# Patient Record
Sex: Female | Born: 1995 | Race: White | Hispanic: No | Marital: Single | Smoking: Never smoker
Health system: Southern US, Community
[De-identification: ages and names within clinical notes are randomized; demographics above are authoritative.]

## PROBLEM LIST (undated history)

## (undated) DIAGNOSIS — E8809 Other disorders of plasma-protein metabolism, not elsewhere classified: Secondary | ICD-10-CM

## (undated) DIAGNOSIS — Z789 Other specified health status: Secondary | ICD-10-CM

## (undated) DIAGNOSIS — K219 Gastro-esophageal reflux disease without esophagitis: Secondary | ICD-10-CM

## (undated) DIAGNOSIS — S3992XA Unspecified injury of lower back, initial encounter: Secondary | ICD-10-CM

## (undated) DIAGNOSIS — R109 Unspecified abdominal pain: Secondary | ICD-10-CM

## (undated) HISTORY — PX: NO PAST SURGERIES: SHX2092

## (undated) HISTORY — PX: HERNIA REPAIR: SHX51

---

## 2018-08-08 ENCOUNTER — Other Ambulatory Visit: Payer: Self-pay

## 2018-08-08 DIAGNOSIS — Z20822 Contact with and (suspected) exposure to covid-19: Secondary | ICD-10-CM

## 2018-08-09 ENCOUNTER — Telehealth: Payer: Self-pay | Admitting: General Practice

## 2018-08-09 LAB — NOVEL CORONAVIRUS, NAA: SARS-CoV-2, NAA: NOT DETECTED

## 2018-08-09 NOTE — Telephone Encounter (Signed)
Pt received negative covid results.   °

## 2018-09-23 ENCOUNTER — Encounter: Payer: Self-pay | Admitting: Emergency Medicine

## 2018-09-23 ENCOUNTER — Emergency Department
Admission: EM | Admit: 2018-09-23 | Discharge: 2018-09-23 | Disposition: A | Discharge status: Home / Self Care | Payer: Medicaid Other | Attending: Emergency Medicine | Admitting: Emergency Medicine

## 2018-09-23 ENCOUNTER — Other Ambulatory Visit: Payer: Self-pay

## 2018-09-23 DIAGNOSIS — J069 Acute upper respiratory infection, unspecified: Secondary | ICD-10-CM

## 2018-09-23 DIAGNOSIS — Z20828 Contact with and (suspected) exposure to other viral communicable diseases: Secondary | ICD-10-CM | POA: Insufficient documentation

## 2018-09-23 DIAGNOSIS — R0981 Nasal congestion: Secondary | ICD-10-CM | POA: Diagnosis present

## 2018-09-23 LAB — GROUP A STREP BY PCR: Group A Strep by PCR: NOT DETECTED

## 2018-09-23 MED ORDER — LIDOCAINE VISCOUS HCL 2 % MT SOLN: 10.0000 mL | OROMUCOSAL | 0 refills | Status: DC | PRN

## 2018-09-23 MED ORDER — LIDOCAINE VISCOUS HCL 2 % MT SOLN
15.0000 mL | Freq: Once | OROMUCOSAL | Status: AC
  Administered 2018-09-23: 16:00:00 15 mL via OROMUCOSAL
  Filled 2018-09-23: qty 15

## 2018-09-23 MED ORDER — ACETAMINOPHEN 325 MG PO TABS
650.0000 mg | ORAL_TABLET | Freq: Once | ORAL | Status: AC
  Administered 2018-09-23: 650 mg via ORAL
  Filled 2018-09-23: qty 2

## 2018-09-23 NOTE — ED Provider Notes (Signed)
[facility] Emergency Department Provider Note  ____________________________________________  Time seen: Approximately 4:03 PM  I have reviewed the triage vital signs and the nursing notes.   HISTORY  Chief Complaint Sore Throat and Generalized Body Aches    HPI Diane Wyatt is a 23 y.o. female presents to the emergency department for evaluation of nasal congestion, sore throat, nonproductive cough, body aches for 2 days.  Patient states that she is only coughing because her sore throat is scratchy.  Patient is 3 months pregnant.  She sees OB/GYN on Tuesday.  No vomiting, abdominal pain, vaginal bleeding.  No pregnancy concerns.  No fever, shortness of breath, chest pain.  History reviewed. No pertinent past medical history.  There are no active problems to display for this patient.   History reviewed. No pertinent surgical history.  Prior to Admission medications   Medication Sig Start Date End Date Taking? Authorizing Provider  lidocaine (XYLOCAINE) 2 % solution Use as directed 10 mLs in the mouth or throat as needed. Swish and spit 09/23/18   Laban Emperor, PA-C    Allergies Patient has no known allergies.  History reviewed. No pertinent family history.  Social History Social History   Tobacco Use  . Smoking status: Never Smoker  . Smokeless tobacco: Never Used  Substance Use Topics  . Alcohol use: Never    Frequency: Never  . Drug use: Never     Review of Systems  Constitutional: No fever/chills Eyes: No visual changes. No discharge. ENT: Positive for congestion and rhinorrhea. Cardiovascular: No chest pain. Respiratory: Positive for occasional non productive cough. No SOB. Gastrointestinal: No abdominal pain.  No nausea, no vomiting.  No diarrhea.  No constipation. Musculoskeletal: Negative for musculoskeletal pain. Skin: Negative for rash, abrasions, lacerations, ecchymosis. Neurological: Negative for  headaches.   ____________________________________________   PHYSICAL EXAM:  VITAL SIGNS: ED Triage Vitals  Enc Vitals Group     BP 09/23/18 1415 119/73     Pulse Rate 09/23/18 1415 (!) 106     Resp 09/23/18 1415 16     Temp 09/23/18 1415 98.9 F (37.2 C)     Temp Source 09/23/18 1415 Oral     SpO2 09/23/18 1415 99 %     Weight 09/23/18 1415 113 lb (51.3 kg)     Height 09/23/18 1415 5\' 1"  (1.549 m)     Head Circumference --      Peak Flow --      Pain Score 09/23/18 1419 8     Pain Loc --      Pain Edu? --      Excl. in [place]? --      Constitutional: Alert and oriented. Well appearing and in no acute distress. Eyes: Conjunctivae are normal. PERRL. EOMI. No discharge. Head: Atraumatic. ENT: No frontal and maxillary sinus tenderness.      Ears: Tympanic membranes pearly gray with good landmarks. No discharge.      Nose: Mild congestion/rhinnorhea.      Mouth/Throat: Mucous membranes are moist. Oropharynx erythematous. Tonsils not enlarged. No exudates. Uvula midline. Neck: No stridor.   Hematological/Lymphatic/Immunilogical: No cervical lymphadenopathy. Cardiovascular: Normal rate, regular rhythm.  Good peripheral circulation. Respiratory: Normal respiratory effort without tachypnea or retractions. Lungs CTAB. Good air entry to the bases with no decreased or absent breath sounds. Gastrointestinal: Bowel sounds 4 quadrants. Soft and nontender to palpation. No guarding or rigidity. No palpable masses. No distention. Musculoskeletal: Full range of motion to all extremities. No gross deformities appreciated. Neurologic:  Normal speech and language. No gross focal neurologic deficits are appreciated.  Skin:  Skin is warm, dry and intact. No rash noted. Psychiatric: Mood and affect are normal. Speech and behavior are normal. Patient exhibits appropriate insight and judgement.   ____________________________________________   LABS (all labs ordered are listed, but only abnormal  results are displayed)  Labs Reviewed  GROUP A STREP BY PCR  NOVEL CORONAVIRUS, NAA (HOSP ORDER, SEND-OUT TO REF LAB; TAT 18-24 HRS)   ____________________________________________  EKG   ____________________________________________  RADIOLOGY  No results found.  ____________________________________________    PROCEDURES  Procedure(s) performed:    Procedures    Medications  lidocaine (XYLOCAINE) 2 % viscous mouth solution 15 mL (15 mLs Mouth/Throat Given 09/23/18 1613)  acetaminophen (TYLENOL) tablet 650 mg (650 mg Oral Given 09/23/18 1612)     ____________________________________________   INITIAL IMPRESSION / ASSESSMENT AND PLAN / ED COURSE  Pertinent labs & imaging results that were available during my care of the patient were reviewed by me and considered in my medical decision making (see chart for details).  Review of the [place] CSRS was performed in accordance of the [facility] prior to dispensing any controlled drugs.     Patient's diagnosis is consistent with viral URI. Vital signs and exam are reassuring.  Strep test is negative.  COVID test is pending.  Patient is 3 months pregnant and has follow-up with OB on Tuesday.  She denies any pregnancy concerns today.  No abdominal pain, vaginal bleeding.  No fevers.  Patient appears well and is staying well hydrated. Patient feels comfortable going home. Patient will be discharged home with prescriptions for viscous lidocaine to swish and spit. Patient is to follow up with obstetrics as needed or otherwise directed. Patient is given ED precautions to return to the ED for any worsening or new symptoms.  Diane LewandowskyJulie Wyatt was evaluated in Emergency Department on 09/23/2018 for the symptoms described in the history of present illness. She was evaluated in the context of the global COVID-19 pandemic, which necessitated consideration that the patient might be at risk for infection with the SARS-CoV-2 virus that causes COVID-19.  Institutional protocols and algorithms that pertain to the evaluation of patients at risk for COVID-19 are in a state of rapid change based on information released by regulatory bodies including the CDC and federal and state organizations. These policies and algorithms were followed during the patient's care in the ED.   ____________________________________________  FINAL CLINICAL IMPRESSION(S) / ED DIAGNOSES  Final diagnoses:  Viral URI      NEW MEDICATIONS STARTED DURING THIS VISIT:  ED Discharge Orders         Ordered    lidocaine (XYLOCAINE) 2 % solution  As needed     09/23/18 1611              This chart was dictated using voice recognition software/Dragon. Despite best efforts to proofread, errors can occur which can change the meaning. Any change was purely unintentional.    [place] DerryWagner, Markiesha Delia, PA-C 09/23/18 1732    Jene EveryKinner, Robert, MD 09/25/18 [phone]

## 2018-09-23 NOTE — ED Notes (Signed)
See triage note   Presents with sore throat for couple of days  Afebrile on arrival

## 2018-09-23 NOTE — ED Notes (Signed)
First Nurse Note: Pt c/o Sore throat and body aches. Pt is in NAD at this time.

## 2018-09-23 NOTE — Discharge Instructions (Signed)
Your strep test was negative.  Your COVID test is pending.  Please follow-up with obstetrics on Tuesday.  You can use mouthwash to swish and spit.  Take Tylenol for pain.  Return to the emergency department for any worsening of symptoms.

## 2018-09-23 NOTE — ED Triage Notes (Signed)
Pt has sore throat that started yesterday. Also c/o sore throat and body aches

## 2018-09-25 ENCOUNTER — Telehealth: Payer: Self-pay | Admitting: General Practice

## 2018-09-25 LAB — NOVEL CORONAVIRUS, NAA (HOSP ORDER, SEND-OUT TO REF LAB; TAT 18-24 HRS): SARS-CoV-2, NAA: NOT DETECTED

## 2018-09-25 NOTE — Telephone Encounter (Signed)
Negative COVID results given. Patient results "NOT Detected." Caller expressed understanding. ° °

## 2018-10-26 ENCOUNTER — Emergency Department: Payer: Medicaid Other

## 2018-10-26 ENCOUNTER — Other Ambulatory Visit: Payer: Self-pay

## 2018-10-26 ENCOUNTER — Emergency Department
Admission: EM | Admit: 2018-10-26 | Discharge: 2018-10-26 | Disposition: A | Discharge status: Home / Self Care | Payer: Medicaid Other | Attending: Emergency Medicine | Admitting: Emergency Medicine

## 2018-10-26 DIAGNOSIS — R109 Unspecified abdominal pain: Secondary | ICD-10-CM

## 2018-10-26 DIAGNOSIS — O26892 Other specified pregnancy related conditions, second trimester: Secondary | ICD-10-CM

## 2018-10-26 DIAGNOSIS — O99891 Other specified diseases and conditions complicating pregnancy: Secondary | ICD-10-CM | POA: Diagnosis present

## 2018-10-26 DIAGNOSIS — Z3A15 15 weeks gestation of pregnancy: Secondary | ICD-10-CM | POA: Insufficient documentation

## 2018-10-26 DIAGNOSIS — Z3492 Encounter for supervision of normal pregnancy, unspecified, second trimester: Secondary | ICD-10-CM

## 2018-10-26 LAB — URINALYSIS, COMPLETE (UACMP) WITH MICROSCOPIC
Bilirubin Urine: NEGATIVE
Glucose, UA: NEGATIVE mg/dL
Hgb urine dipstick: NEGATIVE
Ketones, ur: NEGATIVE mg/dL
Leukocytes,Ua: NEGATIVE
Nitrite: NEGATIVE
Protein, ur: NEGATIVE mg/dL
Specific Gravity, Urine: 1.012 (ref 1.005–1.030)
pH: 6 (ref 5.0–8.0)

## 2018-10-26 LAB — COMPREHENSIVE METABOLIC PANEL
ALT: 18 U/L (ref 0–44)
AST: 17 U/L (ref 15–41)
Albumin: 3.9 g/dL (ref 3.5–5.0)
Alkaline Phosphatase: 27 U/L — ABNORMAL LOW (ref 38–126)
Anion gap: 8 (ref 5–15)
BUN: 7 mg/dL (ref 6–20)
CO2: 24 mmol/L (ref 22–32)
Calcium: 9.2 mg/dL (ref 8.9–10.3)
Chloride: 106 mmol/L (ref 98–111)
Creatinine, Ser: 0.47 mg/dL (ref 0.44–1.00)
GFR calc Af Amer: >60 mL/min (ref >60)
GFR calc non Af Amer: >60 mL/min (ref >60)
Glucose, Bld: 105 mg/dL — ABNORMAL HIGH (ref 70–99)
Potassium: 3.6 mmol/L (ref 3.5–5.1)
Sodium: 138 mmol/L (ref 135–145)
Total Bilirubin: 0.5 mg/dL (ref 0.3–1.2)
Total Protein: 6.6 g/dL (ref 6.5–8.1)

## 2018-10-26 LAB — HCG, QUANTITATIVE, PREGNANCY: hCG, Beta Chain, Quant, S: 44624 m[IU]/mL — ABNORMAL HIGH (ref <5)

## 2018-10-26 LAB — CBC
HCT: 34.1 % — ABNORMAL LOW (ref 36.0–46.0)
Hemoglobin: 11.2 g/dL — ABNORMAL LOW (ref 12.0–15.0)
MCH: 31.5 pg (ref 26.0–34.0)
MCHC: 32.8 g/dL (ref 30.0–36.0)
MCV: 95.8 fL (ref 80.0–100.0)
Platelets: 148 10*3/uL — ABNORMAL LOW (ref 150–400)
RBC: 3.56 MIL/uL — ABNORMAL LOW (ref 3.87–5.11)
RDW: 13.2 % (ref 11.5–15.5)
WBC: 6.7 10*3/uL (ref 4.0–10.5)
nRBC: 0 % (ref 0.0–0.2)

## 2018-10-26 NOTE — ED Notes (Signed)
Patient transported to Ultrasound 

## 2018-10-26 NOTE — Discharge Instructions (Signed)
Return to the ER for worsening symptoms, persistent vomiting, vaginal bleeding or other concerns.
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
## 2019-01-19 ENCOUNTER — Other Ambulatory Visit: Payer: Self-pay

## 2019-01-19 ENCOUNTER — Observation Stay
Admission: EM | Admit: 2019-01-19 | Discharge: 2019-01-19 | Disposition: A | Discharge status: Home / Self Care | Payer: Medicaid Other | Attending: Obstetrics and Gynecology | Admitting: Obstetrics and Gynecology

## 2019-01-19 DIAGNOSIS — Z3A28 28 weeks gestation of pregnancy: Secondary | ICD-10-CM | POA: Diagnosis not present

## 2019-01-19 DIAGNOSIS — O368130 Decreased fetal movements, third trimester, not applicable or unspecified: Secondary | ICD-10-CM | POA: Diagnosis not present

## 2019-01-19 HISTORY — DX: Other specified health status: Z78.9

## 2019-01-19 NOTE — OB Triage Note (Signed)
Discharge instructions reviewed with patient and patient educated on fetal kick counts.Pt instructed when to call provider or return to the hospital for evaluation. Pt discharged home in stable condition with significant other.

## 2019-01-19 NOTE — Progress Notes (Signed)
Provider notified of patient arrival to unit. Dr. Valentino Saxon notified that Fetal baseline is 150 with 15x15 accels present and no decels noted. Verbal order given to Discharge patient home.
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
## 2019-01-20 NOTE — Discharge Summary (Signed)
L&D OB Triage Note  Diane Wyatt is an unassigned 24 y.o. G1P0 female at 108w0d, EDD Estimated Date of Delivery: 04/13/19 who presented to triage for complaints of decreased fetal movement x 1 day. She receives [facility]. Reports that she tried drinking soda and taking a hot shower but still could not elicit movement. She was evaluated by the nurses with no significant findings. Vital signs stable. An NST was performed and has been reviewed by MD.    NST INTERPRETATION: Indications: decreased fetal movement  Mode: External Baseline Rate (A): 150 bpm Variability: Moderate Accelerations: 15 x 15 Decelerations: None     Contraction Frequency (min): occasional with UI  Impression: reactive   Plan: NST performed was reviewed and was found to be reactive. She was discharged home with fetal kick count precautions.  Continue routine prenatal care. Follow up with OB/GYN as previously scheduled.     Hildred Laser, MD  [facility]

## 2019-03-06 ENCOUNTER — Other Ambulatory Visit: Payer: Self-pay

## 2019-03-06 ENCOUNTER — Observation Stay
Admission: EM | Admit: 2019-03-06 | Discharge: 2019-03-06 | Disposition: A | Discharge status: Home / Self Care | Payer: Medicaid Other | Attending: Obstetrics and Gynecology | Admitting: Obstetrics and Gynecology

## 2019-03-06 ENCOUNTER — Encounter: Payer: Self-pay | Admitting: Obstetrics and Gynecology

## 2019-03-06 DIAGNOSIS — Z3490 Encounter for supervision of normal pregnancy, unspecified, unspecified trimester: Secondary | ICD-10-CM

## 2019-03-06 DIAGNOSIS — O26893 Other specified pregnancy related conditions, third trimester: Principal | ICD-10-CM | POA: Insufficient documentation

## 2019-03-06 DIAGNOSIS — R109 Unspecified abdominal pain: Secondary | ICD-10-CM | POA: Insufficient documentation

## 2019-03-06 DIAGNOSIS — R1030 Lower abdominal pain, unspecified: Secondary | ICD-10-CM

## 2019-03-06 DIAGNOSIS — Z3A34 34 weeks gestation of pregnancy: Secondary | ICD-10-CM | POA: Insufficient documentation

## 2019-03-06 DIAGNOSIS — O99891 Other specified diseases and conditions complicating pregnancy: Secondary | ICD-10-CM | POA: Diagnosis not present

## 2019-03-06 DIAGNOSIS — M545 Low back pain: Secondary | ICD-10-CM

## 2019-03-06 DIAGNOSIS — R102 Pelvic and perineal pain: Secondary | ICD-10-CM | POA: Diagnosis not present

## 2019-03-06 HISTORY — DX: Unspecified injury of lower back, initial encounter: S39.92XA

## 2019-03-06 NOTE — Discharge Instructions (Signed)
Round Ligament Pain  The round ligament is a cord of muscle and tissue that helps support the uterus. It can become a source of pain during pregnancy if it becomes stretched or twisted as the baby grows. The pain usually begins in the second trimester (13-28 weeks) of pregnancy, and it can come and go until the baby is delivered. It is not a serious problem, and it does not cause harm to the baby. Round ligament pain is usually a short, sharp, and pinching pain, but it can also be a dull, lingering, and aching pain. The pain is felt in the lower side of the abdomen or in the groin. It usually starts deep in the groin and moves up to the outside of the hip area. The pain may occur when you:  Suddenly change position, such as quickly going from a sitting to standing position.  Roll over in bed.  Cough or sneeze.  Do physical activity. Follow these instructions at home:   Watch your condition for any changes.  When the pain starts, relax. Then try any of these methods to help with the pain: ? Sitting down. ? Flexing your knees up to your abdomen. ? Lying on your side with one pillow under your abdomen and another pillow between your legs. ? Sitting in a warm bath for 15-20 minutes or until the pain goes away.  Take over-the-counter and prescription medicines only as told by your health care provider.  Move slowly when you sit down or stand up.  Avoid long walks if they cause pain.  Stop or reduce your physical activities if they cause pain.  Keep all follow-up visits as told by your health care provider. This is important. Contact a health care provider if:  Your pain does not go away with treatment.  You feel pain in your back that you did not have before.  Your medicine is not helping. Get help right away if:  You have a fever or chills.  You develop uterine contractions.  You have vaginal bleeding.  You have nausea or vomiting.  You have diarrhea.  You have pain  when you urinate. Summary  Round ligament pain is felt in the lower abdomen or groin. It is usually a short, sharp, and pinching pain. It can also be a dull, lingering, and aching pain.  This pain usually begins in the second trimester (13-28 weeks). It occurs because the uterus is stretching with the growing baby, and it is not harmful to the baby.  You may notice the pain when you suddenly change position, when you cough or sneeze, or during physical activity.  Relaxing, flexing your knees to your abdomen, lying on one side, or taking a warm bath may help to get rid of the pain.  Get help from your health care provider if the pain does not go away or if you have vaginal bleeding, nausea, vomiting, diarrhea, or painful urination. This information is not intended to replace advice given to you by your health care provider. Make sure you discuss any questions you have with your health care provider. Document Revised: 06/07/2017 Document Reviewed: 06/07/2017 Elsevier Patient Education  2020 [employer] LABOR: When contractions begin, you should start to time them from the beginning of one contraction to the beginning of the next.  When contractions are 5-10 minutes apart or less and have been regular for at least an hour, you should call your health care provider.  Notify your doctor if any of the following   occur: 1. Bleeding from the vagina 7. Sudden, constant, or occasional abdominal pain  2. Pain or burning when urinating 8. Sudden gushing of fluid from the vagina (with or without continued leaking)  3. Chills or fever 9. Fainting spells, "black outs" or loss of consciousness  4. Increase in vaginal discharge 10. Severe or continued nausea or vomiting  5. Pelvic pressure (sudden increase) 11. Blurring of vision or spots before the eyes  6. Baby moving less than usual 12. Leaking of fluid    FETAL KICK COUNT: Lie on your left side for one hour after a meal, and count the number of times  your baby kicks. If it is less than 5 times, get up, move around and drink some juice. Repeat the test 30 minutes later. If it is still less than 5 kicks in an hour, notify your doctor. 
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
## 2019-03-06 NOTE — Progress Notes (Signed)
Pt to be discharged home. Encouraged to drink PO fluids, follow up with primary OB regarding back pain, and encouraged to call back if anything changes or if she develops new or worsening symptoms. FHT @ 2129 145 bpm.

## 2019-03-07 NOTE — Discharge Summary (Signed)
    L&D OB Triage Note  SUBJECTIVE Diane Wyatt is a 24 y.o. G1P0 female at [redacted]w[redacted]d, EDD Estimated Date of Delivery: 04/13/19 who presented to triage with complaints of back and abdominal pain that began around midnight last night. Says pain started in her lower back where she has a previous back injury, then radiated to her lower abdomen and groin.  Reports pain was a 9/10 last night and a 5/10 now.  Denies vaginal bleeding, LOF, and decreased fetal movement.   OB History  Gravida Para Term Preterm AB Living  1 0 0 0 0 0  SAB TAB Ectopic Multiple Live Births  0 0 0 0 0    # Outcome Date GA Lbr Len/2nd Weight Sex Delivery Anes PTL Lv  1 Current             No medications prior to admission.     OBJECTIVE  Nursing Evaluation:   BP 112/73 (BP Location: Right Arm)   Pulse (!) 102   Temp 98.1 F (36.7 C) (Oral)   Resp 16   Ht 5\' 1"  (1.549 m)   Wt 65.8 kg   LMP  (LMP Unknown)   BMI 27.40 kg/m    Findings:        Patient not in labor.     Fetal reassurance     No evidence of significant pathology, in fact back pain improving.      NST was performed and has been reviewed by me.  NST INTERPRETATION: Category I  Mode: External Baseline Rate (A): 145 bpm Variability: Moderate Accelerations: 15 x 15 Decelerations: None     Contraction Frequency (min): irregular irritability  ASSESSMENT Impression:  1.  Pregnancy:  G1P0 at [redacted]w[redacted]d , EDD Estimated Date of Delivery: 04/13/19 2.  Reassuring fetal and maternal status  PLAN 1. Discussed current condition and above findings with patient and reassurance given.  All questions answered. 2. Discharge home with standard labor precautions given to return to L&D or call the office for problems. 3. Continue routine prenatal care.

## 2019-06-12 ENCOUNTER — Other Ambulatory Visit: Payer: Self-pay

## 2019-06-12 ENCOUNTER — Ambulatory Visit
Admission: EM | Admit: 2019-06-12 | Discharge: 2019-06-12 | Disposition: A | Discharge status: Home / Self Care | Payer: Medicaid Other | Attending: Family Medicine | Admitting: Family Medicine

## 2019-06-12 DIAGNOSIS — J029 Acute pharyngitis, unspecified: Secondary | ICD-10-CM | POA: Diagnosis present

## 2019-06-12 LAB — GROUP A STREP BY PCR: Group A Strep by PCR: NOT DETECTED

## 2019-06-12 MED ORDER — AMOXICILLIN 500 MG PO TABS: 500.0000 mg | ORAL_TABLET | Freq: Two times a day (BID) | ORAL | 0 refills | Status: DC

## 2019-06-12 NOTE — ED Provider Notes (Addendum)
[facility]    CSN: [id number] Arrival date & time: 06/12/19  1451      History   Chief Complaint Chief Complaint  Patient presents with   Sore Throat   HPI  24 year old female presents with pharyngitis.  Patient reports that her symptoms started yesterday.  She reports sore throat.  Severe.  9/10 in severity.  Patient reports left-sided tonsillar swelling and redness.  She reports a prior history of strep pharyngitis.  She is concerned that she has strep.  She reports associated fever, T-max 100.1.  No other reported symptoms.  No relieving factors.  No other complaints.  Home Medications    Prior to Admission medications   Medication Sig Start Date End Date Taking? Authorizing Provider  Multiple Vitamins-Minerals (MULTIVITAMIN WITH MINERALS) tablet Take 1 tablet by mouth daily.   Yes [provider]  amoxicillin (AMOXIL) 500 MG tablet Take 1 tablet (500 mg total) by mouth 2 (two) times daily. 06/12/19   Tommie Sams, DO    Family History Family History  Problem Relation Age of Onset   Healthy Mother    Healthy Father     Social History Social History   Tobacco Use   Smoking status: Never Smoker   Smokeless tobacco: Never Used  Substance Use Topics   Alcohol use: Never   Drug use: Never     Allergies   Patient has no known allergies.   Review of Systems Review of Systems  Constitutional: Positive for fever.  HENT: Positive for sore throat and trouble swallowing.    Physical Exam Triage Vital Signs ED Triage Vitals  Enc Vitals Group     BP 06/12/19 1505 108/77     Pulse Rate 06/12/19 1505 (!) 125     Resp 06/12/19 1505 18     Temp 06/12/19 1505 99.4 F (37.4 C)     Temp Source 06/12/19 1505 Oral     SpO2 06/12/19 1505 99 %     Weight 06/12/19 1502 132 lb (59.9 kg)     Height 06/12/19 1502 5\' 1"  (1.549 m)     Head Circumference --      Peak Flow --      Pain Score 06/12/19 1502 9     Pain Loc --      Pain Edu? --    Excl. in GC? --    Updated Vital Signs BP 108/77 (BP Location: Right Arm)    Pulse (!) 125    Temp 99.4 F (37.4 C) (Oral)    Resp 18    Ht 5\' 1"  (1.549 m)    Wt 59.9 kg    LMP 05/29/2019    SpO2 99%    Breastfeeding No    BMI 24.94 kg/m   Visual Acuity Right Eye Distance:   Left Eye Distance:   Bilateral Distance:    Right Eye Near:   Left Eye Near:    Bilateral Near:     Physical Exam Vitals and nursing note reviewed.  Constitutional:      General: She is not in acute distress.    Appearance: Normal appearance. She is not ill-appearing.  HENT:     Head: Normocephalic and atraumatic.     Mouth/Throat:      Comments: Oropharynx with erythema.  Mild edema at the labelled location. Eyes:     General:        Right eye: No discharge.        Left eye: No discharge.  Conjunctiva/sclera: Conjunctivae normal.  Cardiovascular:     Rate and Rhythm: Regular rhythm. Tachycardia present.  Pulmonary:     Effort: Pulmonary effort is normal.     Breath sounds: Normal breath sounds. No wheezing, rhonchi or rales.  Neurological:     Mental Status: She is alert.  Psychiatric:        Mood and Affect: Mood normal.        Behavior: Behavior normal.    UC Treatments / Results  Labs (all labs ordered are listed, but only abnormal results are displayed) Labs Reviewed  GROUP A STREP BY PCR    EKG   Radiology No results found.  Procedures Procedures (including critical care time)  Medications Ordered in UC Medications - No data to display  Initial Impression / Assessment and Plan / UC Course  I have reviewed the triage vital signs and the nursing notes.  Pertinent labs & imaging results that were available during my care of the patient were reviewed by me and considered in my medical decision making (see chart for details).    24 year old female presents with pharyngitis.  Patient with recent fever, tachycardia, and exam findings concerning for strep pharyngitis.  Strep  PCR was negative.  Given presentation and symptomatology, I am placing her empirically on amoxicillin.  Final Clinical Impressions(s) / UC Diagnoses   Final diagnoses:  Acute pharyngitis, unspecified etiology   Discharge Instructions   None    ED Prescriptions    Medication Sig Dispense Auth. Provider   amoxicillin (AMOXIL) 500 MG tablet Take 1 tablet (500 mg total) by mouth 2 (two) times daily. 20 tablet Coral Spikes, DO     PDMP not reviewed this encounter.   Coral Spikes, [place] 06/12/19 1554

## 2019-06-12 NOTE — ED Triage Notes (Signed)
Patient complains of sore throat that started yesterday. States that she noticed a white spot on left tonsil. Hx of strep frequently.

## 2019-09-08 ENCOUNTER — Emergency Department
Admission: EM | Admit: 2019-09-08 | Discharge: 2019-09-08 | Disposition: A | Discharge status: Home / Self Care | Payer: Medicaid Other | Attending: Emergency Medicine | Admitting: Emergency Medicine

## 2019-09-08 ENCOUNTER — Other Ambulatory Visit: Payer: Self-pay

## 2019-09-08 DIAGNOSIS — R61 Generalized hyperhidrosis: Secondary | ICD-10-CM | POA: Insufficient documentation

## 2019-09-08 DIAGNOSIS — Z5321 Procedure and treatment not carried out due to patient leaving prior to being seen by health care provider: Secondary | ICD-10-CM | POA: Insufficient documentation

## 2019-09-08 DIAGNOSIS — R55 Syncope and collapse: Secondary | ICD-10-CM | POA: Insufficient documentation

## 2019-09-08 DIAGNOSIS — R42 Dizziness and giddiness: Secondary | ICD-10-CM | POA: Diagnosis not present

## 2019-09-08 DIAGNOSIS — R110 Nausea: Secondary | ICD-10-CM | POA: Insufficient documentation

## 2019-09-08 LAB — URINALYSIS, COMPLETE (UACMP) WITH MICROSCOPIC
Bilirubin Urine: NEGATIVE
Glucose, UA: NEGATIVE mg/dL
Ketones, ur: NEGATIVE mg/dL
Leukocytes,Ua: NEGATIVE
Nitrite: NEGATIVE
Protein, ur: NEGATIVE mg/dL
Specific Gravity, Urine: 1.009 (ref 1.005–1.030)
pH: 5 (ref 5.0–8.0)

## 2019-09-08 LAB — CBC
HCT: 35.4 % — ABNORMAL LOW (ref 36.0–46.0)
Hemoglobin: 10.8 g/dL — ABNORMAL LOW (ref 12.0–15.0)
MCH: 25.6 pg — ABNORMAL LOW (ref 26.0–34.0)
MCHC: 30.5 g/dL (ref 30.0–36.0)
MCV: 83.9 fL (ref 80.0–100.0)
Platelets: 221 10*3/uL (ref 150–400)
RBC: 4.22 MIL/uL (ref 3.87–5.11)
RDW: 15.5 % (ref 11.5–15.5)
WBC: 3.9 10*3/uL — ABNORMAL LOW (ref 4.0–10.5)
nRBC: 0 % (ref 0.0–0.2)

## 2019-09-08 LAB — POCT PREGNANCY, URINE: Preg Test, Ur: NEGATIVE

## 2019-09-08 LAB — BASIC METABOLIC PANEL
Anion gap: 9 (ref 5–15)
BUN: 13 mg/dL (ref 6–20)
CO2: 22 mmol/L (ref 22–32)
Calcium: 9.4 mg/dL (ref 8.9–10.3)
Chloride: 106 mmol/L (ref 98–111)
Creatinine, Ser: 0.84 mg/dL (ref 0.44–1.00)
GFR calc Af Amer: >60 mL/min (ref >60)
GFR calc non Af Amer: >60 mL/min (ref >60)
Glucose, Bld: 100 mg/dL — ABNORMAL HIGH (ref 70–99)
Potassium: 3.4 mmol/L — ABNORMAL LOW (ref 3.5–5.1)
Sodium: 137 mmol/L (ref 135–145)

## 2019-09-08 NOTE — ED Triage Notes (Signed)
Pt arrived via POV with from work at [employer] where she is a member of the [employer], pt states she started feeling bad prior to going to work, but states while she was at work, she became dizzy, nauseated, sweaty, and had near syncopal episodes.  Pt alert and oriented on arrival, pt states she recently increased her zoloft from 50mg  to 100mg  about 1.5 weeks ago. Pt states she has only been on zoloft for a month.

## 2019-09-08 NOTE — ED Notes (Signed)
Called   No answer in lobby  

## 2019-09-22 ENCOUNTER — Encounter: Payer: Self-pay | Admitting: Emergency Medicine

## 2019-09-22 ENCOUNTER — Emergency Department
Admission: EM | Admit: 2019-09-22 | Discharge: 2019-09-23 | Disposition: A | Discharge status: Other Acute Inpatient Hospital | Payer: Medicaid Other | Attending: Emergency Medicine | Admitting: Emergency Medicine

## 2019-09-22 ENCOUNTER — Other Ambulatory Visit: Payer: Self-pay

## 2019-09-22 DIAGNOSIS — R634 Abnormal weight loss: Secondary | ICD-10-CM | POA: Diagnosis not present

## 2019-09-22 DIAGNOSIS — Z20822 Contact with and (suspected) exposure to covid-19: Secondary | ICD-10-CM | POA: Insufficient documentation

## 2019-09-22 DIAGNOSIS — Z046 Encounter for general psychiatric examination, requested by authority: Secondary | ICD-10-CM | POA: Insufficient documentation

## 2019-09-22 DIAGNOSIS — G8929 Other chronic pain: Secondary | ICD-10-CM | POA: Diagnosis not present

## 2019-09-22 DIAGNOSIS — F329 Major depressive disorder, single episode, unspecified: Secondary | ICD-10-CM | POA: Diagnosis not present

## 2019-09-22 DIAGNOSIS — F32A Depression, unspecified: Secondary | ICD-10-CM

## 2019-09-22 LAB — URINE DRUG SCREEN, QUALITATIVE (ARMC ONLY)
Amphetamines, Ur Screen: NOT DETECTED
Barbiturates, Ur Screen: NOT DETECTED
Benzodiazepine, Ur Scrn: NOT DETECTED
Cannabinoid 50 Ng, Ur ~~LOC~~: NOT DETECTED
Cocaine Metabolite,Ur ~~LOC~~: NOT DETECTED
MDMA (Ecstasy)Ur Screen: NOT DETECTED
Methadone Scn, Ur: NOT DETECTED
Opiate, Ur Screen: NOT DETECTED
Phencyclidine (PCP) Ur S: NOT DETECTED
Tricyclic, Ur Screen: NOT DETECTED

## 2019-09-22 LAB — COMPREHENSIVE METABOLIC PANEL
ALT: 16 U/L (ref 0–44)
AST: 24 U/L (ref 15–41)
Albumin: 4.8 g/dL (ref 3.5–5.0)
Alkaline Phosphatase: 41 U/L (ref 38–126)
Anion gap: 13 (ref 5–15)
BUN: 7 mg/dL (ref 6–20)
CO2: 23 mmol/L (ref 22–32)
Calcium: 9.5 mg/dL (ref 8.9–10.3)
Chloride: 104 mmol/L (ref 98–111)
Creatinine, Ser: 0.85 mg/dL (ref 0.44–1.00)
GFR calc Af Amer: >60 mL/min (ref >60)
GFR calc non Af Amer: >60 mL/min (ref >60)
Glucose, Bld: 94 mg/dL (ref 70–99)
Potassium: 3.5 mmol/L (ref 3.5–5.1)
Sodium: 140 mmol/L (ref 135–145)
Total Bilirubin: 0.9 mg/dL (ref 0.3–1.2)
Total Protein: 7.3 g/dL (ref 6.5–8.1)

## 2019-09-22 LAB — CBC
HCT: 35.3 % — ABNORMAL LOW (ref 36.0–46.0)
Hemoglobin: 10.8 g/dL — ABNORMAL LOW (ref 12.0–15.0)
MCH: 26.2 pg (ref 26.0–34.0)
MCHC: 30.6 g/dL (ref 30.0–36.0)
MCV: 85.7 fL (ref 80.0–100.0)
Platelets: 173 10*3/uL (ref 150–400)
RBC: 4.12 MIL/uL (ref 3.87–5.11)
RDW: 16.5 % — ABNORMAL HIGH (ref 11.5–15.5)
WBC: 3.7 10*3/uL — ABNORMAL LOW (ref 4.0–10.5)
nRBC: 0 % (ref 0.0–0.2)

## 2019-09-22 LAB — ACETAMINOPHEN LEVEL: Acetaminophen (Tylenol), Serum: <10 ug/mL — ABNORMAL LOW (ref 10–30)

## 2019-09-22 LAB — ETHANOL: Alcohol, Ethyl (B): <10 mg/dL (ref <10)

## 2019-09-22 LAB — POCT PREGNANCY, URINE: Preg Test, Ur: NEGATIVE

## 2019-09-22 LAB — SALICYLATE LEVEL: Salicylate Lvl: <7 mg/dL — ABNORMAL LOW (ref 7.0–30.0)

## 2019-09-22 NOTE — ED Provider Notes (Signed)
[facility] Emergency Department Provider Note ____________________________________________   First MD Initiated Contact with Patient 09/22/19 2146     (approximate)  I have reviewed the triage vital signs and the nursing notes.  HISTORY  Chief Complaint Post partum depression   HPI Diane Wyatt is a 24 y.o. femalewho presents to the ED for evaluation of depression.   Chart review indicates patient delivered in the [facility] healthcare system in April 2021. Delivered healthy baby at term vaginally on 04/08/19, discharged on day 2.   Patient reports waiting in the car in a parking lot because her fianc checked in for a separate issue.  She reports while sitting the car developing acutely worsening of her chronic depression such that she felt like she could no longer function, so she checked herself into the ED for evaluation.  She denies any suicidality or plans.  Denies hallucinations.  She reports a 10 pound weight loss over the past couple months due to her poor appetite.  She reports difficulty performing the smallest task at home or caring for her child due to this lack of motivation and depression.  She is reports having "no energy."   Past Medical History:  Diagnosis Date  . Back injury   . Medical history non-contributory     Patient Active Problem List   Diagnosis Date Noted  . Pregnancy 03/06/2019  . Indication for care in labor and delivery, antepartum 01/19/2019    Past Surgical History:  Procedure Laterality Date  . HERNIA REPAIR    . NO PAST SURGERIES      Prior to Admission medications   Medication Sig Start Date End Date Taking? Authorizing Provider  amoxicillin (AMOXIL) 500 MG tablet Take 1 tablet (500 mg total) by mouth 2 (two) times daily. 06/12/19   Tommie Sams, DO  Multiple Vitamins-Minerals (MULTIVITAMIN WITH MINERALS) tablet Take 1 tablet by mouth daily.    [provider]    Allergies Doxycycline  Family History   Problem Relation Age of Onset  . Healthy Mother   . Healthy Father     Social History Social History   Tobacco Use  . Smoking status: Never Smoker  . Smokeless tobacco: Never Used  Vaping Use  . Vaping Use: Never used  Substance Use Topics  . Alcohol use: Never  . Drug use: Never    Review of Systems  Constitutional: No fever/chills Eyes: No visual changes. ENT: No sore throat. Cardiovascular: Denies chest pain. Respiratory: Denies shortness of breath. Gastrointestinal: No abdominal pain.  No nausea, no vomiting.  No diarrhea.  No constipation. Genitourinary: Negative for dysuria. Musculoskeletal: Negative for back pain. Skin: Negative for rash. Neurological: Negative for headaches, focal weakness or numbness. Psychiatric: Positive for depression  ____________________________________________   PHYSICAL EXAM:  VITAL SIGNS: Vitals:   09/22/19 2114  BP: (!) 136/103  Pulse: 98  Resp: 18  Temp: 98.6 F (37 C)  SpO2: 100%      Constitutional: Alert and oriented. Well appearing and in no acute distress. Eyes: Conjunctivae are normal. PERRL. EOMI. Head: Atraumatic. Nose: No congestion/rhinnorhea. Mouth/Throat: Mucous membranes are moist.  Oropharynx non-erythematous. Neck: No stridor. No cervical spine tenderness to palpation. Cardiovascular: Normal rate, regular rhythm. Grossly normal heart sounds.  Good peripheral circulation. Respiratory: Normal respiratory effort.  No retractions. Lungs CTAB. Gastrointestinal: Soft , nondistended, nontender to palpation. No abdominal bruits. No CVA tenderness. Musculoskeletal: No lower extremity tenderness nor edema.  No joint effusions. No signs of acute trauma. Neurologic:  Normal speech and language. No gross focal neurologic deficits are appreciated. No gait instability noted. Skin:  Skin is warm, dry and intact. No rash noted. Psychiatric: Flat affect but with linear thought  processes.  ____________________________________________   LABS (all labs ordered are listed, but only abnormal results are displayed)  Labs Reviewed  SALICYLATE LEVEL - Abnormal; Notable for the following components:      Result Value   Salicylate Lvl <7.0 (*)    All other components within normal limits  ACETAMINOPHEN LEVEL - Abnormal; Notable for the following components:   Acetaminophen (Tylenol), Serum <10 (*)    All other components within normal limits  CBC - Abnormal; Notable for the following components:   WBC 3.7 (*)    Hemoglobin 10.8 (*)    HCT 35.3 (*)    RDW 16.5 (*)    All other components within normal limits  SARS CORONAVIRUS 2 BY RT PCR (HOSPITAL ORDER, PERFORMED IN [facility])  ETHANOL  URINE DRUG SCREEN, QUALITATIVE ([facility] ONLY)  COMPREHENSIVE METABOLIC PANEL  POC URINE PREG, ED  POCT PREGNANCY, URINE  POC URINE PREG, ED   ____________________________________________   PROCEDURES and INTERVENTIONS  Procedure(s) performed (including Critical Care):  Procedures  Medications - No data to display  ____________________________________________   MDM / ED COURSE  24 year old female presents to the ED with acute depression requiring psychiatric evaluation for possible inpatient stay.  Normal vital signs on room air.  Exam reassuring without evidence of self-harm, trauma or distress.  She does have a flat affect, but with linear thought processes and seems to have good insight into her condition.  She is adamantly denying any suicidality, plans for suicide, hallucinations or homicidality.  She has a strong support system at home with extended family caring for her baby as well as her fianc.  I am concerned about the severity of her depression due to her weight loss and reports of poor functioning at home.  While have no indications to IVC this patient this time, I think she should stay in the ED until she can be evaluated by TTS and psychiatry for  further psychiatric management.  Patient signed out to oncoming provider, Dr. York Cerise, to facilitate this evaluation.  Clinical Course as of Sep 22 2314  Wynelle Link Sep 22, 2019  2249 The patient has been placed in psychiatric observation due to the need to provide a safe environment for the patient while obtaining psychiatric consultation and evaluation, as well as ongoing medical and medication management to treat the patient's condition. The patient has not been placed under full IVC at this time.     [DS]    Clinical Course User Index [DS] Delton Prairie, MD     ____________________________________________   FINAL CLINICAL IMPRESSION(S) / ED DIAGNOSES  Final diagnoses:  Acute depression     ED Discharge Orders    None       Khyri Hinzman Katrinka Wyatt   Note:  This document was prepared using Dragon voice recognition software and may include unintentional dictation errors.   Delton Prairie, MD 09/22/19 [phone]

## 2019-09-22 NOTE — ED Notes (Signed)
Pt changed out of flip flops, red t-shirt, black pants, white bra, and tan underwear into hospital provided attire.

## 2019-09-22 NOTE — ED Notes (Signed)

## 2019-09-22 NOTE — ED Triage Notes (Signed)
Pt arrives POV with c/o depression episode in her vehicle while waiting for her husband to be seen. 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 Pt denies SI or HI at this time but also is waiting to see a psychiatrist in order to get her medication right at this time.

## 2019-09-22 NOTE — ED Notes (Signed)
Pt. Alert and oriented, warm and dry, in no distress. Pt. Denies SI, HI, and AVH. 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 Per patient she does not want to get out of bed and just wants to sleep. She feels like her Zoloft 100 mg is not working. Patient states she is willing to be inpatient if needed to help with her depression. Pt. Encouraged to let nursing staff know of any concerns or needs.

## 2019-09-23 ENCOUNTER — Inpatient Hospital Stay
Admission: AD | Admit: 2019-09-23 | Discharge: 2019-09-24 | DRG: 885 | Disposition: A | Discharge status: Home / Self Care | Payer: Medicaid Other | Source: Intra-hospital | Attending: Psychiatry | Admitting: Psychiatry

## 2019-09-23 DIAGNOSIS — Z881 Allergy status to other antibiotic agents status: Secondary | ICD-10-CM

## 2019-09-23 DIAGNOSIS — Z793 Long term (current) use of hormonal contraceptives: Secondary | ICD-10-CM

## 2019-09-23 DIAGNOSIS — Z818 Family history of other mental and behavioral disorders: Secondary | ICD-10-CM

## 2019-09-23 DIAGNOSIS — Z79899 Other long term (current) drug therapy: Secondary | ICD-10-CM

## 2019-09-23 DIAGNOSIS — F321 Major depressive disorder, single episode, moderate: Secondary | ICD-10-CM | POA: Diagnosis not present

## 2019-09-23 DIAGNOSIS — F329 Major depressive disorder, single episode, unspecified: Secondary | ICD-10-CM | POA: Diagnosis not present

## 2019-09-23 DIAGNOSIS — F332 Major depressive disorder, recurrent severe without psychotic features: Secondary | ICD-10-CM | POA: Diagnosis present

## 2019-09-23 LAB — SARS CORONAVIRUS 2 BY RT PCR (HOSPITAL ORDER, PERFORMED IN ~~LOC~~ HOSPITAL LAB): SARS Coronavirus 2: NEGATIVE

## 2019-09-23 MED ORDER — ALUM & MAG HYDROXIDE-SIMETH 200-200-20 MG/5ML PO SUSP: 30.0000 mL | ORAL | Status: DC | PRN

## 2019-09-23 MED ORDER — ACETAMINOPHEN 325 MG PO TABS: 650.0000 mg | ORAL_TABLET | Freq: Four times a day (QID) | ORAL | Status: DC | PRN

## 2019-09-23 MED ORDER — ADULT MULTIVITAMIN W/MINERALS CH
1.0000 | ORAL_TABLET | Freq: Every day | ORAL | Status: DC
  Administered 2019-09-24: 1 via ORAL
  Filled 2019-09-23: qty 1

## 2019-09-23 MED ORDER — ADULT MULTIVITAMIN W/MINERALS CH: 1.0000 | ORAL_TABLET | Freq: Every day | ORAL | Status: DC

## 2019-09-23 MED ORDER — NORGESTIMATE-ETH ESTRADIOL 0.25-35 MG-MCG PO TABS: 1.0000 | ORAL_TABLET | Freq: Every day | ORAL | Status: DC

## 2019-09-23 MED ORDER — DULOXETINE HCL 30 MG PO CPEP
30.0000 mg | ORAL_CAPSULE | Freq: Every day | ORAL | Status: DC
  Filled 2019-09-23 (×2): qty 1

## 2019-09-23 MED ORDER — ZOLPIDEM TARTRATE 5 MG PO TABS
5.0000 mg | ORAL_TABLET | ORAL | Status: AC
  Administered 2019-09-23: 5 mg via ORAL
  Filled 2019-09-23: qty 1

## 2019-09-23 MED ORDER — OLANZAPINE 5 MG PO TABS: 5.0000 mg | ORAL_TABLET | Freq: Every day | ORAL | Status: DC

## 2019-09-23 MED ORDER — MAGNESIUM HYDROXIDE 400 MG/5ML PO SUSP: 30.0000 mL | Freq: Every day | ORAL | Status: DC | PRN

## 2019-09-23 MED ORDER — DULOXETINE HCL 30 MG PO CPEP
30.0000 mg | ORAL_CAPSULE | Freq: Every day | ORAL | Status: DC
  Administered 2019-09-24: 30 mg via ORAL
  Filled 2019-09-23: qty 1

## 2019-09-23 MED ORDER — OLANZAPINE 5 MG PO TABS
5.0000 mg | ORAL_TABLET | Freq: Every day | ORAL | Status: DC
  Administered 2019-09-23: 5 mg via ORAL
  Filled 2019-09-23: qty 1

## 2019-09-23 NOTE — ED Notes (Signed)
Gave food tray with juice. 

## 2019-09-23 NOTE — Consult Note (Addendum)
[facility] Face-to-Face Psychiatry Consult   Reason for Consult:   Post partum depression Referring Physician:  ED MD  Patient Identification: Diane Wyatt MRN:  [id number] Principal Diagnosis: <principal problem not specified> Diagnosis:  Active Problems:   * No active hospital problems. *  Post partum Depression  Adjustment Disorder   Total Time spent with patient:   20-30   Subjective:   Diane Wyatt is a 24 y.o. female patient admitted with  Post p artum depression and situational issues post partum   HPI:  Seen by TTS --originally was going to go to outpatient here --voluntary basis but now elects to psych facility either here or ----referral   She still feels elements of depression mood swings ---ups and downs, and related mood issues   Does not have active SI HI or plans   However she feels she needs structured inpatient setting with new medications before going home  I and SW tried to discharge based on what she said during the day   However she felt differently and so evening ED doc held her until this am   Of note there is no TTS this week  And so  I spoke to her about these matters and this is one example of how  Dispositions do change     Past Psychiatric History:   Risk to Self: Suicidal Ideation: No Suicidal Intent: No Is patient at risk for suicide?: No Suicidal Plan?: No Access to Means: No What has been your use of drugs/alcohol within the last 12 months?: None How many times?: 0 Other Self Harm Risks: None Triggers for Past Attempts: None known Intentional Self Injurious Behavior: None Risk to Others: Homicidal Ideation: No Thoughts of Harm to Others: No Current Homicidal Intent: No Current Homicidal Plan: No Access to Homicidal Means: No Identified Victim: None History of harm to others?: No Assessment of Violence: None Noted Violent Behavior Description: None Does patient have access to weapons?: No Criminal Charges Pending?: No Does patient  have a court date: No Prior Inpatient Therapy: Prior Inpatient Therapy: No Prior Outpatient Therapy: Prior Outpatient Therapy: No Does patient have an ACCT team?: No Does patient have Intensive In-House Services?  : No Does patient have [facility] services? : No Does patient have P4CC services?: No  Past Medical History:  Past Medical History:  Diagnosis Date  . Back injury   . Medical history non-contributory     Past Surgical History:  Procedure Laterality Date  . HERNIA REPAIR    . NO PAST SURGERIES     Family History:  Family History  Problem Relation Age of Onset  . Healthy Mother   . Healthy Father    Family Psychiatric  History:   None reported  Social History:  Social History   Substance and Sexual Activity  Alcohol Use Never     Social History   Substance and Sexual Activity  Drug Use Never    Social History   Socioeconomic History  . Marital status: Single    Spouse name: Not on file  . Number of children: Not on file  . Years of education: Not on file  . Highest education level: Not on file  Occupational History  . Not on file  Tobacco Use  . Smoking status: Never Smoker  . Smokeless tobacco: Never Used  Vaping Use  . Vaping Use: Never used  Substance and Sexual Activity  . Alcohol use: Never  . Drug use: Never  . Sexual activity: Yes  Birth control/protection: Pill  Other Topics Concern  . Not on file  Social History Narrative  . Not on file   Social Determinants of Health   Financial Resource Strain:   . Difficulty of Paying Living Expenses: Not on file  Food Insecurity:   . Worried About Programme researcher, broadcasting/film/video in the Last Year: Not on file  . Ran Out of Food in the Last Year: Not on file  Transportation Needs:   . Lack of Transportation (Medical): Not on file  . Lack of Transportation (Non-Medical): Not on file  Physical Activity:   . Days of Exercise per Week: Not on file  . Minutes of Exercise per Session: Not on file  Stress:    . Feeling of Stress : Not on file  Social Connections:   . Frequency of Communication with Friends and Family: Not on file  . Frequency of Social Gatherings with Friends and Family: Not on file  . Attends Religious Services: Not on file  . Active Member of Clubs or Organizations: Not on file  . Attends Banker Meetings: Not on file  . 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   In laws and husband help her however.  She is looking and has been advised by this MD to look for day time work   Substance drug and ETOH   None  Court and legal issues none  Education ---possibly returning after some time     she feels bonding is okay and does not have impulses to distance or harm child  No psychosis or severe anxiety per se   Allergies:   Allergies  Allergen Reactions  . Doxycycline Hives    Labs:  Results for orders placed or performed during the hospital encounter of 09/22/19 (from the past 48 hour(s))  Ethanol     Status: None   Collection Time: 09/22/19  9:35 PM  Result Value Ref Range   Alcohol, Ethyl (B) <10 <10 mg/dL    Comment: (NOTE) Lowest detectable limit for serum alcohol is 10 mg/dL.  For medical purposes only. Performed at [facility], [address] [place], [place] [address]   Salicylate level     Status: Abnormal   Collection Time: 09/22/19  9:35 PM  Result Value Ref Range   Salicylate Lvl <7.0 (L) 7.0 - 30.0 mg/dL    Comment: Performed at [facility], [address] [place], [place] [address]  Acetaminophen level     Status: Abnormal   Collection Time: 09/22/19  9:35 PM  Result Value Ref Range   Acetaminophen (Tylenol), Serum <10 (L) 10 - 30 ug/mL    Comment: (NOTE) Therapeutic concentrations vary significantly. A range of 10-30 ug/mL  may be an effective concentration for many patients. However, some   are best treated at concentrations outside of this range. Acetaminophen concentrations >150 ug/mL at 4 hours after ingestion  and >50 ug/mL at 12 hours after ingestion are often associated with  toxic reactions.  Performed at [facility], [address] [place], [place] [address]   cbc     Status: Abnormal   Collection Time: 09/22/19  9:35 PM  Result Value Ref Range   WBC 3.7 (L) 4.0 - 10.5 K/uL   RBC 4.12 3.87 - 5.11 MIL/uL   Hemoglobin 10.8 (L) 12.0 - 15.0 g/dL  HCT 35.3 (L) 36 - 46 %   MCV 85.7 80.0 - 100.0 fL   MCH 26.2 26.0 - 34.0 pg   MCHC 30.6 30.0 - 36.0 g/dL   RDW 70.0 (H) 17.4 - 94.4 %   Platelets 173 150 - 400 K/uL   nRBC 0.0 0.0 - 0.2 %    Comment: Performed at [facility], [address], [place], [place] [address]  Urine Drug Screen, Qualitative     Status: None   Collection Time: 09/22/19  9:35 PM  Result Value Ref Range   Tricyclic, Ur Screen NONE DETECTED NONE DETECTED   Amphetamines, Ur Screen NONE DETECTED NONE DETECTED   MDMA (Ecstasy)Ur Screen NONE DETECTED NONE DETECTED   Cocaine Metabolite,Ur [place] NONE DETECTED NONE DETECTED   Opiate, Ur Screen NONE DETECTED NONE DETECTED   Phencyclidine (PCP) Ur S NONE DETECTED NONE DETECTED   Cannabinoid 50 Ng, Ur [place] NONE DETECTED NONE DETECTED   Barbiturates, Ur Screen NONE DETECTED NONE DETECTED   Benzodiazepine, Ur Scrn NONE DETECTED NONE DETECTED   Methadone Scn, Ur NONE DETECTED NONE DETECTED    Comment: (NOTE) Tricyclics + metabolites, urine    Cutoff 1000 ng/mL Amphetamines + metabolites, urine  Cutoff 1000 ng/mL MDMA (Ecstasy), urine              Cutoff 500 ng/mL Cocaine Metabolite, urine          Cutoff 300 ng/mL Opiate + metabolites, urine        Cutoff 300 ng/mL Phencyclidine (PCP), urine         Cutoff 25 ng/mL Cannabinoid, urine                 Cutoff 50 ng/mL Barbiturates + metabolites, urine  Cutoff 200 ng/mL Benzodiazepine, urine              Cutoff 200 ng/mL Methadone,  urine                   Cutoff 300 ng/mL  The urine drug screen provides only a preliminary, unconfirmed analytical test result and should not be used for non-medical purposes. Clinical consideration and professional judgment should be applied to any positive drug screen result due to possible interfering substances. A more specific alternate chemical method must be used in order to obtain a confirmed analytical result. Gas chromatography / mass spectrometry (GC/MS) is the preferred confirm atory method. Performed at [facility], [address] [place], [place] [address]   Comprehensive metabolic panel     Status: None   Collection Time: 09/22/19  9:35 PM  Result Value Ref Range   Sodium 140 135 - 145 mmol/L   Potassium 3.5 3.5 - 5.1 mmol/L   Chloride 104 98 - 111 mmol/L   CO2 23 22 - 32 mmol/L   Glucose, Bld 94 70 - 99 mg/dL    Comment: Glucose reference range applies only to samples taken after fasting for at least 8 hours.   BUN 7 6 - 20 mg/dL   Creatinine, Ser 6.65 0.44 - 1.00 mg/dL   Calcium 9.5 8.9 - 99.3 mg/dL   Total Protein 7.3 6.5 - 8.1 g/dL   Albumin 4.8 3.5 - 5.0 g/dL   AST 24 15 - 41 U/L   ALT 16 0 - 44 U/L   Alkaline Phosphatase 41 38 - 126 U/L   Total Bilirubin 0.9 0.3 - 1.2 mg/dL   GFR calc non Af Amer >60 >60 mL/min   GFR calc  Af Amer >60 >60 mL/min   Anion gap 13 5 - 15    Comment: Performed at [facility], [address] [place], [place] [address]  Pregnancy, urine POC     Status: None   Collection Time: 09/22/19  9:41 PM  Result Value Ref Range   Preg Test, Ur NEGATIVE NEGATIVE    Comment:        THE SENSITIVITY OF THIS METHODOLOGY IS >24 mIU/mL   SARS Coronavirus 2 by RT PCR (hospital order, performed in [facility] hospital lab) Nasopharyngeal Nasopharyngeal Swab     Status: None   Collection Time: 09/23/19  6:38 AM   Specimen: Nasopharyngeal Swab  Result Value Ref Range   SARS Coronavirus 2 NEGATIVE NEGATIVE    Comment:  (NOTE) SARS-CoV-2 target nucleic acids are NOT DETECTED.  The SARS-CoV-2 RNA is generally detectable in upper and lower respiratory specimens during the acute phase of infection. The lowest concentration of SARS-CoV-2 viral copies this assay can detect is 250 copies / mL. A negative result does not preclude SARS-CoV-2 infection and should not be used as the sole basis for treatment or other patient management decisions.  A negative result may occur with improper specimen collection / handling, submission of specimen other than nasopharyngeal swab, presence of viral mutation(s) within the areas targeted by this assay, and inadequate number of viral copies (<250 copies / mL). A negative result must be combined with clinical observations, patient history, and epidemiological information.  Fact Sheet for Patients:   BoilerBrush.com.cyhttps://www.fda.gov/media/136312/download  Fact Sheet for Healthcare Providers: https://pope.com/https://www.fda.gov/media/136313/download  This test is not yet approved or  cleared by the [place] FDA and has been authorized for detection and/or diagnosis of SARS-CoV-2 by FDA under an Emergency Use Authorization (EUA).  This EUA will remain in effect (meaning this test can be used) for the duration of the COVID-19 declaration under Section 564(b)(1) of the Act, 21 U.S.C. section 360bbb-3(b)(1), unless the authorization is terminated or revoked sooner.  Performed at [facility], [address] [place], [place] [address]     No current facility-administered medications for this encounter.   Current Outpatient Medications  Medication Sig Dispense Refill  . acetaminophen (TYLENOL) 325 MG tablet Take 2 tabs (650 mg) every 6 hours for the first 3 days after surgery, then every 6 hours as needed for pain.    . Multiple Vitamins-Minerals (MULTIVITAMIN WITH MINERALS) tablet Take 1 tablet by mouth daily.    . norgestimate-ethinyl estradiol (ORTHO-CYCLEN) 0.25-35 MG-MCG tablet  Take by mouth.    . sertraline (ZOLOFT) 100 MG tablet Take 100 mg by mouth daily.      Musculoskeletal: Strength & Muscle Tone:   Normal   Gait & Station:  Normal  Patient leans: n/a   Psychiatric Specialty Exam: Physical Exam  Review of Systems  Blood pressure (!) 136/103, pulse 98, temperature 98.6 F (37 C), temperature source Oral, resp. rate 18, height 5\' 1"  (1.549 m), weight 52.2 kg, last menstrual period 09/02/2019, SpO2 100 %.Body mass index is 21.73 kg/m.  Mental Status  Alert cooperative oriented times four Consciousness not clouded or fluctuant Concentration and attention okay No tics shakes tremors Speech normal rate tone volume fluency Thought process and content --no frank psychosis or mania  Memory remote recent immediate normal Fund of knowledge, intelligence, judgement insight reliability normal SI and HI ---none Abstraction normal  Rapport eye contact normal Appearance normal  Psychomotor activity --normal  Recall normal Language okay  Akathisia none Handedness normal Aims --not needed Assets --supportive family she is working ADL's--needs help sometimes Cognition challenged when tired Sleep erratic             Treatment Plan Summary:  Now awaits bed transfer   meds changed to SNRI since SSRI not working   Cymbalta 30 started Along with Zyprexa low dose for sleep and mood stabilization    Zoloft 100 for two months has not been working  So she agrees to change meds    She agrees to this and knows risks and benefits  Explained it may take a few days for bed    Disposition:   Now awaits bed transfer PREVIOUS disposition was to go home  SHE decided to change so I am supporting her in this    Again --reminders to TTS and SW that these issues change as part of the dynamics of ER psych          Roselind Messier, MD 09/23/2019 12:27 PM

## 2019-09-23 NOTE — ED Notes (Signed)
Pt eating lunch tray at this time  

## 2019-09-23 NOTE — ED Notes (Signed)
Nurse talked to Patient and she states that she was always a happy person until after the birth of her child and now she has been struggling every day with depression, she states that she does not enjoy things as she once did, states " I love my child so much and want to be the best mom and thought maybe I need some help " Patient denies Si/hi or avh, states she has good self esteem from taking dance most of her life, but just is in a down place right now, states that Her family is very supportive, Nurse will continue to monitor, camera surveillance in progress for safety.

## 2019-09-23 NOTE — ED Notes (Signed)
Pt resting at this time, NAD noted 

## 2019-09-23 NOTE — BH Assessment (Signed)
Assessment Note  Diane Wyatt is an 24 y.o. female presenting to [facility] ED voluntarily for experiencing post partum depression. Per triage note Pt arrives POV with c/o depression episode in her vehicle while waiting for her husband to be seen. 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 Pt denies SI or HI at this time but also is waiting to see a psychiatrist in order to get her medication right at this time. During assessment patient appears alert and oriented x4, calm and cooperative, depressed and sad. Patient reported that she has never experienced depression prior to when she had her 60 month old daughter. Patient reported "I have no appetite, I'm not getting out of bed, I have a lack of motivation." Patient reported she is currently prescribed "Xoloft 100mg " by a provider at [facility]. Patient reported that she was referred to a Psychiatrist for continued medication management but reported "I was supposed to have the appointment this week but I overslept and missed it." Patient reports currently having support from her husband, mother, father and her current in-laws and reports living with her husband, his parents and her husband's brother. Patient also reports working part-time but reports missing work due to her Postpartum. Patient reported that he depression started "4 months in after having the baby it started." Patient denies any prior mental health history. Patient denies current SI/HI/AH/VH.   Per Psyc MD Dr.  [facility] patient is recommended for discharge with resources to Outpatient therapy and re-make appointment with Psychiatrist. Discussed discharge with patient and discussed resources but patient did not feel comfortable discharging, she reported that she was having racing thoughts earlier today. EDP Dr. Smith Robert agreed to keep patient overnight and have patient reassessed in the morning by Psyc MD.   Diagnosis: Postpartum Depression  Past Medical History:   Past Medical History:  Diagnosis Date  . Back injury   . Medical history non-contributory     Past Surgical History:  Procedure Laterality Date  . HERNIA REPAIR    . NO PAST SURGERIES      Family History:  Family History  Problem Relation Age of Onset  . Healthy Mother   . Healthy Father     Social History:  reports that she has never smoked. She has never used smokeless tobacco. She reports that she does not drink alcohol and does not use drugs.  Additional Social History:  Alcohol / Drug Use Pain Medications: See MAR Prescriptions: See MAR Over the Counter: See MAR History of alcohol / drug use?: No history of alcohol / drug abuse  CIWA: CIWA-Ar BP: (!) 136/103 Pulse Rate: 98 COWS:    Allergies:  Allergies  Allergen Reactions  . Doxycycline Hives    Home Medications: (Not in a hospital admission)   OB/GYN Status:  Patient's last menstrual period was 09/02/2019 (exact date).  General Assessment Data Location of Assessment: [facility] TTS Assessment: In system Is this a Tele or Face-to-Face Assessment?: Face-to-Face Is this an Initial Assessment or a Re-assessment for this encounter?: Initial Assessment Patient Accompanied by:: N/A Language Other than English: No Living Arrangements: Other (Comment) What gender do you identify as?: Female Marital status: Married Pregnancy Status: No Living Arrangements: Spouse/significant other, Non-relatives/Friends Can pt return to current living arrangement?: Yes Admission Status: Voluntary Is patient capable of signing voluntary admission?: Yes Referral Source: Self/Family/Friend Insurance type: Medicaid  Medical Screening Exam [facility] Walk-[facility] ONLY) Medical Exam completed: Yes  Crisis Care Plan Living Arrangements: Spouse/significant other, Non-relatives/Friends  Legal Guardian: Other: (Self) Name of Psychiatrist: None currently Name of Therapist: None currently  Education Status Is patient currently in school?:  No Is the patient employed, unemployed or receiving disability?: Employed  Risk to self with the past 6 months Suicidal Ideation: No Has patient been a risk to self within the past 6 months prior to admission? : No Suicidal Intent: No Has patient had any suicidal intent within the past 6 months prior to admission? : No Is patient at risk for suicide?: No Suicidal Plan?: No Has patient had any suicidal plan within the past 6 months prior to admission? : No Access to Means: No What has been your use of drugs/alcohol within the last 12 months?: None Previous Attempts/Gestures: No How many times?: 0 Other Self Harm Risks: None Triggers for Past Attempts: None known Intentional Self Injurious Behavior: None Family Suicide History: No Recent stressful life event(s): Other (Comment) (Post partum depression) Persecutory voices/beliefs?: No Depression: Yes Depression Symptoms: Isolating, Loss of interest in usual pleasures Substance abuse history and/or treatment for substance abuse?: No Suicide prevention information given to non-admitted patients: Not applicable  Risk to Others within the past 6 months Homicidal Ideation: No Does patient have any lifetime risk of violence toward others beyond the six months prior to admission? : No Thoughts of Harm to Others: No Current Homicidal Intent: No Current Homicidal Plan: No Access to Homicidal Means: No Identified Victim: None History of harm to others?: No Assessment of Violence: None Noted Violent Behavior Description: None Does patient have access to weapons?: No Criminal Charges Pending?: No Does patient have a court date: No Is patient on probation?: No  Psychosis Hallucinations: None noted Delusions: None noted  Mental Status Report Appearance/Hygiene: In scrubs Eye Contact: Good Motor Activity: Freedom of movement Speech: Logical/coherent Level of Consciousness: Alert Mood: Sad Affect: Appropriate to  circumstance Anxiety Level: None Thought Processes: Coherent Judgement: Unimpaired Orientation: Person, Place, Time, Situation, Appropriate for developmental age Obsessive Compulsive Thoughts/Behaviors: None  Cognitive Functioning Concentration: Normal Memory: Recent Intact, Remote Intact Is patient IDD: No Insight: Good Impulse Control: Good Appetite: Poor Have you had any weight changes? : No Change Sleep: Decreased Total Hours of Sleep: 0 Vegetative Symptoms: None  ADLScreening [facility] Assessment Services) Patient's cognitive ability adequate to safely complete daily activities?: Yes Patient able to express need for assistance with ADLs?: Yes Independently performs ADLs?: Yes (appropriate for developmental age)  Prior Inpatient Therapy Prior Inpatient Therapy: No  Prior Outpatient Therapy Prior Outpatient Therapy: No Does patient have an ACCT team?: No Does patient have Intensive In-House Services?  : No Does patient have [facility] services? : No Does patient have P4CC services?: No  ADL Screening (condition at time of admission) Patient's cognitive ability adequate to safely complete daily activities?: Yes Is the patient deaf or have difficulty hearing?: No Does the patient have difficulty seeing, even when wearing glasses/contacts?: No Does the patient have difficulty concentrating, remembering, or making decisions?: No Patient able to express need for assistance with ADLs?: Yes Does the patient have difficulty dressing or bathing?: No Independently performs ADLs?: Yes (appropriate for developmental age) Does the patient have difficulty walking or climbing stairs?: No Weakness of Legs: None Weakness of Arms/Hands: None  Home Assistive Devices/Equipment Home Assistive Devices/Equipment: None  Therapy Consults (therapy consults require a physician order) PT Evaluation Needed: No OT Evalulation Needed: No SLP Evaluation Needed: No Abuse/Neglect Assessment (Assessment  to be complete while patient is alone) Abuse/Neglect Assessment Can Be Completed: Yes Physical Abuse:  Denies Verbal Abuse: Denies Sexual Abuse: Denies Exploitation of patient/patient's resources: Denies Self-Neglect: Denies Values / Beliefs Cultural Requests During Hospitalization: None Spiritual Requests During Hospitalization: None Consults Spiritual Care Consult Needed: No Transition of Care Team Consult Needed: No Advance Directives (For Healthcare) Does Patient Have a Medical Advance Directive?: No Would patient like information on creating a medical advance directive?: No - Patient declined          Disposition: Per Psyc MD Dr.  Smith Robert patient is recommended for discharge with resources to Outpatient therapy and re-make appointment with Psychiatrist. Discussed discharge with patient and discussed resources but patient did not feel comfortable discharging, she reported that she was having racing thoughts earlier today. EDP Dr. York Cerise agreed to keep patient overnight and have patient reassessed in the morning by Psyc MD. Disposition Initial Assessment Completed for this Encounter: Yes  On Site Evaluation by:   Reviewed with Physician:    Benay Pike MS LCASA 09/23/2019 12:50 AM

## 2019-09-23 NOTE — ED Notes (Signed)
This RN attempted to call [facility] RN to see if bed ready for pt. Unable to reach RN at this time

## 2019-09-23 NOTE — ED Notes (Signed)
Patient states that she hopes to be able to discharge tonight, states that her Mom and Dad will let her come to their house for a few days, and [place] and it [place] her good. Nurse talked to the Doctor Smith Robert and He states that He would reassess her tomorrow and maybe she could be discharged. Nurse let the Patient know and she agreed.

## 2019-09-23 NOTE — ED Provider Notes (Signed)
-----------------------------------------   12:15 AM on 09/23/2019 -----------------------------------------  The patient was evaluated by TTS who staffed the patient with Dr. Smith Robert the psychiatrist.  Dr. Smith Robert initially recommended that the patient be discharged for outpatient follow-up.  However, TTS then spoke with the patient about discharge and close outpatient follow-up and the patient states that she does not feel safe to go home given her depression and racing thoughts earlier and she is afraid of what she might do and that she may present a danger to herself or her 35-month-old baby.  Under the circumstances I think it is appropriate to keep her in the emergency department for reassessment by psychiatry in the morning.  That will continue to be the plan.   Diane Rose, MD 09/23/19 [phone]

## 2019-09-23 NOTE — ED Notes (Signed)
Vol/pending psych consult. 

## 2019-09-23 NOTE — ED Notes (Signed)
Patient transferred from quad to room 5, nurse will continue to monitor, oriented her to the unit.

## 2019-09-23 NOTE — BH Assessment (Signed)
Patient is to be admitted to [facility] by Dr. Smith Robert.  Attending Physician will be Dr. Toni Amend.   Patient has been assigned to room 320, by [facility] Charge Nurse Brand[place]ill, W.   Intake Paper Work has been signed and placed on patient chart.  ER staff is aware of the admission:  Lynden Ang, ER Secretary    Dr. Vicente Males, ER MD   Dewayne Hatch, Patient's Nurse   Leotis Shames, Patient Access.

## 2019-09-23 NOTE — ED Notes (Signed)
VOL, moved to BHU 5 

## 2019-09-23 NOTE — ED Notes (Signed)
Pt given breakfast tray

## 2019-09-24 ENCOUNTER — Encounter: Payer: Self-pay | Admitting: Internal Medicine

## 2019-09-24 ENCOUNTER — Other Ambulatory Visit: Payer: Self-pay

## 2019-09-24 DIAGNOSIS — F321 Major depressive disorder, single episode, moderate: Principal | ICD-10-CM

## 2019-09-24 MED ORDER — DULOXETINE HCL 30 MG PO CPEP: 60.0000 mg | ORAL_CAPSULE | Freq: Every day | ORAL | Status: DC

## 2019-09-24 MED ORDER — ADULT MULTIVITAMIN W/MINERALS CH: 1.0000 | ORAL_TABLET | Freq: Every day | ORAL | 1 refills | Status: AC

## 2019-09-24 MED ORDER — DULOXETINE HCL 60 MG PO CPEP
60.0000 mg | ORAL_CAPSULE | Freq: Every day | ORAL | 1 refills | Status: AC
Start: 2019-09-25 — End: ?

## 2019-09-24 NOTE — Plan of Care (Signed)
Patient new to the unit tonight, hasn't had time to progress  Problem: Education: Goal: Knowledge of [facility] Education information/materials will improve Outcome: Not Progressing Goal: Emotional status will improve Outcome: Not Progressing Goal: Mental status will improve Outcome: Not Progressing Goal: Verbalization of understanding the information provided will improve Outcome: Not Progressing   Problem: Safety: Goal: Periods of time without injury will increase Outcome: Not Progressing   Problem: Education: Goal: Utilization of techniques to improve thought processes will improve Outcome: Not Progressing Goal: Knowledge of the prescribed therapeutic regimen will improve Outcome: Not Progressing   Problem: Safety: Goal: Ability to disclose and discuss suicidal ideas will improve Outcome: Not Progressing Goal: Ability to identify and utilize support systems that promote safety will improve Outcome: Not Progressing   

## 2019-09-24 NOTE — Progress Notes (Signed)
D: Pt alert and oriented. Pt rates depression 1/10, hopelessness 0/10, and anxiety 0/10. Pt goal: "Going home to my fiance and daughter." Pt reports energy level as normal and concentration as being good. Pt reports sleep last night as being good. Pt did not receive medications for sleep. Pt denies experiencing any pain at this time. Pt denies experiencing any SI/HI, or AVH at this time.   A: Scheduled medications administered to pt, per MD orders. Support and encouragement provided. Frequent verbal contact made. Routine safety checks conducted q15 minutes.   R: No adverse drug reactions noted. Pt verbally contracts for safety at this time. Pt complaint with medications and treatment plan. Pt interacts well with others on the unit minimally. Pt remains safe at this time. Will continue to monitor.

## 2019-09-24 NOTE — Tx Team (Signed)
Interdisciplinary Treatment and Diagnostic Plan Update  09/24/2019 Time of Session: 9:00AM Diane Wyatt MRN: [id number]  Principal Diagnosis: Moderate major depression, single episode [facility])  Secondary Diagnoses: Principal Problem:   Moderate major depression, single episode (HCC)   Current Medications:  Current Facility-Administered Medications  Medication Dose Route Frequency Provider Last Rate Last Admin  . acetaminophen (TYLENOL) tablet 650 mg  650 mg Oral Q6H PRN Jackelyn Poling, NP      . alum & mag hydroxide-simeth (MAALOX/MYLANTA) 200-200-20 MG/5ML suspension 30 mL  30 mL Oral Q4H PRN Jackelyn Poling, NP      . Melene Muller ON 09/25/2019] DULoxetine (CYMBALTA) DR capsule 60 mg  60 mg Oral Daily Clapacs, John T, MD      . magnesium hydroxide (MILK OF MAGNESIA) suspension 30 mL  30 mL Oral Daily PRN Nira Conn A, NP      . multivitamin with minerals tablet 1 tablet  1 tablet Oral Daily Nira Conn A, NP   1 tablet at 09/24/19 0755  . norgestimate-ethinyl estradiol (ORTHO-CYCLEN) 0.25-35 MG-MCG tablet 1 tablet  1 tablet Oral Daily Jackelyn Poling, NP       Current Outpatient Medications  Medication Sig Dispense Refill  . [START ON 09/25/2019] DULoxetine (CYMBALTA) 60 MG capsule Take 1 capsule (60 mg total) by mouth daily. 30 capsule 1  . [START ON 09/25/2019] Multiple Vitamin (MULTIVITAMIN WITH MINERALS) TABS tablet Take 1 tablet by mouth daily. 30 tablet 1  . norgestimate-ethinyl estradiol (ORTHO-CYCLEN) 0.25-35 MG-MCG tablet Take by mouth.     PTA Medications: No medications prior to admission.    Patient Stressors: Medication change or noncompliance  Patient Strengths: Ability for insight Motivation for treatment/growth Supportive family/friends  Treatment Modalities: Medication Management, Group therapy, Case management,  1 to 1 session with clinician, Psychoeducation, Recreational therapy.   Physician Treatment Plan for Primary Diagnosis: Moderate major depression, single  episode (HCC) Long Term Goal(s): Improvement in symptoms so as ready for discharge Improvement in symptoms so as ready for discharge   Short Term Goals: Ability to verbalize feelings will improve Ability to maintain clinical measurements within normal limits will improve Compliance with prescribed medications will improve  Medication Management: Evaluate patient's response, side effects, and tolerance of medication regimen.  Therapeutic Interventions: 1 to 1 sessions, Unit Group sessions and Medication administration.  Evaluation of Outcomes: Adequate for Discharge  Physician Treatment Plan for Secondary Diagnosis: Principal Problem:   Moderate major depression, single episode (HCC)  Long Term Goal(s): Improvement in symptoms so as ready for discharge Improvement in symptoms so as ready for discharge   Short Term Goals: Ability to verbalize feelings will improve Ability to maintain clinical measurements within normal limits will improve Compliance with prescribed medications will improve     Medication Management: Evaluate patient's response, side effects, and tolerance of medication regimen.  Therapeutic Interventions: 1 to 1 sessions, Unit Group sessions and Medication administration.  Evaluation of Outcomes: Adequate for Discharge   RN Treatment Plan for Primary Diagnosis: Moderate major depression, single episode (HCC) Long Term Goal(s): Knowledge of disease and therapeutic regimen to maintain health will improve  Short Term Goals: Ability to demonstrate self-control, Ability to verbalize feelings will improve, Ability to disclose and discuss suicidal ideas, Ability to identify and develop effective coping behaviors will improve and Compliance with prescribed medications will improve  Medication Management: RN will administer medications as ordered by provider, will assess and evaluate patient's response and provide education to patient for prescribed medication. RN will  report any adverse and/or side effects to prescribing provider.  Therapeutic Interventions: 1 on 1 counseling sessions, Psychoeducation, Medication administration, Evaluate responses to treatment, Monitor vital signs and CBGs as ordered, Perform/monitor CIWA, COWS, AIMS and Fall Risk screenings as ordered, Perform wound care treatments as ordered.  Evaluation of Outcomes: Adequate for Discharge   LCSW Treatment Plan for Primary Diagnosis: Moderate major depression, single episode (HCC) Long Term Goal(s): Safe transition to appropriate next level of care at discharge, Engage patient in therapeutic group addressing interpersonal concerns.  Short Term Goals: Engage patient in aftercare planning with referrals and resources, Increase social support, Increase ability to appropriately verbalize feelings, Increase emotional regulation, Facilitate acceptance of mental health diagnosis and concerns and Increase skills for wellness and recovery  Therapeutic Interventions: Assess for all discharge needs, 1 to 1 time with Social worker, Explore available resources and support systems, Assess for adequacy in community support network, Educate family and significant other(s) on suicide prevention, Complete Psychosocial Assessment, Interpersonal group therapy.  Evaluation of Outcomes: Adequate for Discharge   Progress in Treatment: Attending groups: Yes. Participating in groups: Yes. Taking medication as prescribed: Yes. Toleration medication: Yes. Family/Significant other contact made: Yes, individual(s) contacted:  SPE completed with patient and collateral. Patient understands diagnosis: Yes. Discussing patient identified problems/goals with staff: Yes. Medical problems stabilized or resolved: Yes. Denies suicidal/homicidal ideation: Yes. Issues/concerns per patient self-inventory: No. Other: none  New problem(s) identified: No, Describe:  none  New Short Term/Long Term Goal(s):  Patient Goals:   "fetting the energy back that I had before having my daughter"  Discharge Plan or Barriers: CSW will assist patient in developing appropriate discharge plans.    Reason for Continuation of Hospitalization: Anxiety Depression Medication stabilization  Estimated Length of Stay:  1-7 days  Attendees: Patient: Diane Wyatt 09/24/2019 1:36 PM  Physician: Dr. Toni Amend, MD  09/24/2019 1:36 PM  Nursing: Jorene Minors, RN 09/24/2019 1:36 PM  RN Care Manager: 09/24/2019 1:36 PM  Social Worker:  Penni Homans, LCSW 09/24/2019 1:36 PM  Recreational Therapist: Garret Reddish, Drue Flirt, LRT 09/24/2019 1:36 PM  Other:  09/24/2019 1:36 PM  Other:  09/24/2019 1:36 PM  Other: 09/24/2019 1:36 PM    Scribe for Treatment Team: Harden Mo, LCSW 09/24/2019 1:36 PM

## 2019-09-24 NOTE — Progress Notes (Signed)
  [facility] Adult Case Management Discharge Plan :  Will you be returning to the same living situation after discharge:  Yes,  pt reports that she is returning home.  At discharge, do you have transportation home?: Yes,  pt reports that her car is on campus. Do you have the ability to pay for your medications: Yes,  [employer].  Release of information consent forms completed and in the chart;  Patient's signature needed at discharge.  Patient to Follow up at:  Follow-up Information    [facility] Follow up.   Why: Your appointment is scheduled for 10/02/2019 with Lorella Nimrod, Peer Support Specialist.  Thanks! Contact information: [address] [place] [address] [phone]               Next level of care provider has access to [facility] Link:no  Safety Planning and Suicide Prevention discussed: Yes,  SPE completed with the patient.   Have you used any form of tobacco in the last 30 days? (Cigarettes, Smokeless Tobacco, Cigars, and/or Pipes): No  Has patient been referred to the Quitline?: Patient refused referral  Patient has been referred for addiction treatment: Pt. refused referral  Harden Mo, LCSW 09/24/2019, 10:41 AM

## 2019-09-24 NOTE — H&P (Signed)
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  Patient also attended treatment team.  24 year old woman came voluntarily to the emergency room with complaints of depression.  Apparently she had come to the hospital accompanying her husband who had an outpatient orthopedic appointment and during either the drive over or the time in the parking lot the patient became tearful and felt like she ought to be seen for her depression.  Patient reports that she started to feel depressed a few months after the birth of her daughter.  The daughter is now 42 months old.  Patient's been depressed for about a month.  Mood feels down and fatigued much of the time.  Sad mood with some crying spells.  Some impairment to sleep patterns.  Decreased appetite.  Denies any suicidal or homicidal thoughts at all.  Not acting out in any dangerous way.  Denies any hallucinations or psychotic symptoms.  She did go to see her primary care doctor and was started on Zoloft which she took at 50 mg for about 2 weeks and then it was increased to 100 mg.  She does not notice any change since taking the medicine.  Not drinking not using any drugs. Associated Signs/Symptoms: Depression Symptoms:  depressed mood, loss of energy/fatigue, Duration of Depression Symptoms: No data recorded (Hypo) Manic Symptoms:  None Anxiety Symptoms:  None Psychotic Symptoms:  None Duration of Psychotic Symptoms: No data recorded PTSD Symptoms: Negative Total Time spent with patient: 1 hour  Past Psychiatric History: Patient has no previous psychiatric history before this episode of depression.   No prior hospitalizations.  No prior medication.  No history of alcohol or drug abuse.  No history of suicide self-harm or aggression.  Is the patient at risk to self? No.  Has the patient been a risk to self in the past 6 months? No.  Has the patient been a risk to self within the distant past? No.  Is the patient a risk to others? No.  Has the patient been a risk to others in the past 6 months? No.  Has the patient been a risk to others within the distant past? No.   Prior Inpatient Therapy:   Prior Outpatient Therapy:    Alcohol Screening: 1. How often do you have a drink containing alcohol?: Never 2. How many drinks containing alcohol do you have on a typical day when you are drinking?: 1 or 2 3. How often do you have six or more drinks on one occasion?: Never AUDIT-C Score: 0 4. How often during the last year have you found that you were not able to stop drinking once you had started?: Never 5. How often during the last year have you failed to do what was normally expected from you because of drinking?: Never 6. How often during the last year have you needed a first drink in the morning to get yourself going after a heavy drinking session?: Never 7. How often during the last year have you had a feeling of guilt of remorse after drinking?: Never 8. How often during the last year have you been unable to remember what happened the night before because you had been drinking?: Never 9. Have you or someone  else been injured as a result of your drinking?: No 10. Has a relative or friend or a doctor or another health worker been concerned about your drinking or suggested you cut down?: No Alcohol Use Disorder Identification Test Final Score (AUDIT): 0 Alcohol Brief Interventions/Follow-up: AUDIT Score <7 follow-up not indicated Substance Abuse History in the last 12 months:  No. Consequences of Substance Abuse: Negative Previous Psychotropic Medications: Yes  Psychological Evaluations: Yes   Past Medical History:  Past Medical History:  Diagnosis Date  . Back injury   . Medical history non-contributory     Past Surgical History:  Procedure Laterality Date  . HERNIA REPAIR    . NO PAST SURGERIES     Family History:  Family History  Problem Relation Age of Onset  . Healthy Mother   . Healthy Father    Family Psychiatric  History: Patient reports a family history of depression and possibly bipolar disorder on her mother side of the family but no history of suicide in the family. Tobacco Screening: Have you used any form of tobacco in the last 30 days? (Cigarettes, Smokeless Tobacco, Cigars, and/or Pipes): No Social History:  Social History   Substance and Sexual Activity  Alcohol Use Never     Social History   Substance and Sexual Activity  Drug Use Never    Additional Social History:                           Allergies:   Allergies  Allergen Reactions  . Doxycycline Hives   Lab Results:  Results for orders placed or performed during the hospital encounter of 09/22/19 (from the past 48 hour(s))  Ethanol     Status: None   Collection Time: 09/22/19  9:35 PM  Result Value Ref Range   Alcohol, Ethyl (B) <10 <10 mg/dL    Comment: (NOTE) Lowest detectable limit for serum alcohol is 10 mg/dL.  For medical purposes only. Performed at [facility], [address] [place], [place] [address]   Salicylate level     Status: Abnormal   Collection Time: 09/22/19  9:35 PM  Result Value Ref Range   Salicylate Lvl <7.0 (L) 7.0 - 30.0 mg/dL    Comment: Performed at [facility], [address] [place], [place] [address]  Acetaminophen level     Status: Abnormal   Collection Time: 09/22/19  9:35 PM  Result Value Ref Range   Acetaminophen (Tylenol), Serum <10 (L) 10 - 30 ug/mL    Comment: (NOTE) Therapeutic concentrations vary significantly. A range of 10-30 ug/mL  may be an effective concentration for many patients. However, some   are best treated at concentrations outside of this range. Acetaminophen concentrations >150 ug/mL at 4 hours after ingestion  and >50 ug/mL at 12 hours after ingestion are often associated with  toxic reactions.  Performed at [facility], [address] [place], [place] [address]   cbc     Status: Abnormal   Collection Time: 09/22/19  9:35 PM  Result Value Ref Range   WBC 3.7 (L) 4.0 - 10.5 K/uL   RBC 4.12 3.87 - 5.11 MIL/uL   Hemoglobin 10.8 (L) 12.0 - 15.0 g/dL   HCT 56.3 (L) 36 - 46 %   MCV 85.7 80.0 - 100.0 fL   MCH 26.2 26.0 - 34.0 pg   MCHC 30.6 30.0 - 36.0 g/dL   RDW 87.5 (H) 64.3 - 32.9 %  Platelets 173 150 - 400 K/uL   nRBC 0.0 0.0 - 0.2 %    Comment: Performed at [facility], [address] [place], [place] [address]  Urine Drug Screen, Qualitative     Status: None   Collection Time: 09/22/19  9:35 PM  Result Value Ref Range   Tricyclic, Ur Screen NONE DETECTED NONE DETECTED   Amphetamines, Ur Screen NONE DETECTED NONE DETECTED   MDMA (Ecstasy)Ur Screen NONE DETECTED NONE DETECTED   Cocaine Metabolite,Ur [place] NONE DETECTED NONE DETECTED   Opiate, Ur Screen NONE DETECTED NONE DETECTED   Phencyclidine (PCP) Ur S NONE DETECTED NONE DETECTED   Cannabinoid 50 Ng, Ur [place] NONE DETECTED NONE DETECTED   Barbiturates, Ur Screen NONE DETECTED NONE DETECTED   Benzodiazepine, Ur Scrn NONE DETECTED NONE DETECTED   Methadone Scn, Ur NONE DETECTED NONE DETECTED    Comment: (NOTE) Tricyclics + metabolites, urine    Cutoff 1000 ng/mL Amphetamines + metabolites, urine  Cutoff 1000 ng/mL MDMA (Ecstasy), urine              Cutoff 500 ng/mL Cocaine Metabolite, urine          Cutoff 300 ng/mL Opiate + metabolites, urine        Cutoff 300 ng/mL Phencyclidine (PCP), urine         Cutoff 25 ng/mL Cannabinoid, urine                 Cutoff 50 ng/mL Barbiturates + metabolites, urine  Cutoff 200 ng/mL Benzodiazepine, urine              Cutoff 200 ng/mL Methadone,  urine                   Cutoff 300 ng/mL  The urine drug screen provides only a preliminary, unconfirmed analytical test result and should not be used for non-medical purposes. Clinical consideration and professional judgment should be applied to any positive drug screen result due to possible interfering substances. A more specific alternate chemical method must be used in order to obtain a confirmed analytical result. Gas chromatography / mass spectrometry (GC/MS) is the preferred confirm atory method. Performed at [facility], [address] [place], [place] [address]   Comprehensive metabolic panel     Status: None   Collection Time: 09/22/19  9:35 PM  Result Value Ref Range   Sodium 140 135 - 145 mmol/L   Potassium 3.5 3.5 - 5.1 mmol/L   Chloride 104 98 - 111 mmol/L   CO2 23 22 - 32 mmol/L   Glucose, Bld 94 70 - 99 mg/dL    Comment: Glucose reference range applies only to samples taken after fasting for at least 8 hours.   BUN 7 6 - 20 mg/dL   Creatinine, Ser 0.63 0.44 - 1.00 mg/dL   Calcium 9.5 8.9 - 01.6 mg/dL   Total Protein 7.3 6.5 - 8.1 g/dL   Albumin 4.8 3.5 - 5.0 g/dL   AST 24 15 - 41 U/L   ALT 16 0 - 44 U/L   Alkaline Phosphatase 41 38 - 126 U/L   Total Bilirubin 0.9 0.3 - 1.2 mg/dL   GFR calc non Af Amer >60 >60 mL/min   GFR calc Af Amer >60 >60 mL/min   Anion gap 13 5 - 15    Comment: Performed at [facility], [address], [place], [place] [address]  Pregnancy, urine POC     Status: None   Collection  Time: 09/22/19  9:41 PM  Result Value Ref Range   Preg Test, Ur NEGATIVE NEGATIVE    Comment:        THE SENSITIVITY OF THIS METHODOLOGY IS >24 mIU/mL   SARS Coronavirus 2 by RT PCR (hospital order, performed in [facility] hospital lab) Nasopharyngeal Nasopharyngeal Swab     Status: None   Collection Time: 09/23/19  6:38 AM   Specimen: Nasopharyngeal Swab  Result Value Ref Range   SARS Coronavirus 2 NEGATIVE NEGATIVE    Comment:  (NOTE) SARS-CoV-2 target nucleic acids are NOT DETECTED.  The SARS-CoV-2 RNA is generally detectable in upper and lower respiratory specimens during the acute phase of infection. The lowest concentration of SARS-CoV-2 viral copies this assay can detect is 250 copies / mL. A negative result does not preclude SARS-CoV-2 infection and should not be used as the sole basis for treatment or other patient management decisions.  A negative result may occur with improper specimen collection / handling, submission of specimen other than nasopharyngeal swab, presence of viral mutation(s) within the areas targeted by this assay, and inadequate number of viral copies (<250 copies / mL). A negative result must be combined with clinical observations, patient history, and epidemiological information.  Fact Sheet for Patients:   BoilerBrush.com.cy  Fact Sheet for Healthcare Providers: https://pope.com/  This test is not yet approved or  cleared by the [place] FDA and has been authorized for detection and/or diagnosis of SARS-CoV-2 by FDA under an Emergency Use Authorization (EUA).  This EUA will remain in effect (meaning this test can be used) for the duration of the COVID-19 declaration under Section 564(b)(1) of the Act, 21 U.S.C. section 360bbb-3(b)(1), unless the authorization is terminated or revoked sooner.  Performed at [facility], [address] [place], [place] [address]     Blood Alcohol level:  Lab Results  Component Value Date   [facility] <10 09/22/2019    Metabolic Disorder Labs:  No results found for: HGBA1C, MPG No results found for: PROLACTIN No results found for: CHOL, TRIG, HDL, CHOLHDL, VLDL, LDLCALC  Current Medications: Current Facility-Administered Medications  Medication Dose Route Frequency Provider Last Rate Last Admin  . acetaminophen (TYLENOL) tablet 650 mg  650 mg Oral Q6H PRN Jackelyn Poling, NP       . alum & mag hydroxide-simeth (MAALOX/MYLANTA) 200-200-20 MG/5ML suspension 30 mL  30 mL Oral Q4H PRN Jackelyn Poling, NP      . Melene Muller ON 09/25/2019] DULoxetine (CYMBALTA) DR capsule 60 mg  60 mg Oral Daily Nicholad Kautzman T, MD      . magnesium hydroxide (MILK OF MAGNESIA) suspension 30 mL  30 mL Oral Daily PRN Nira Conn A, NP      . multivitamin with minerals tablet 1 tablet  1 tablet Oral Daily Nira Conn A, NP   1 tablet at 09/24/19 0755  . norgestimate-ethinyl estradiol (ORTHO-CYCLEN) 0.25-35 MG-MCG tablet 1 tablet  1 tablet Oral Daily Nira Conn A, NP       PTA Medications: Medications Prior to Admission  Medication Sig Dispense Refill Last Dose  . Multiple Vitamins-Minerals (MULTIVITAMIN WITH MINERALS) tablet Take 1 tablet by mouth daily.     . norgestimate-ethinyl estradiol (ORTHO-CYCLEN) 0.25-35 MG-MCG tablet Take by mouth.       Musculoskeletal: Strength & Muscle Tone: within normal limits Gait & Station: normal Patient leans: N/A  Psychiatric Specialty Exam: Physical Exam Vitals and nursing note reviewed.  Constitutional:      Appearance:  She is well-developed.  HENT:     Head: Normocephalic and atraumatic.  Eyes:     Conjunctiva/sclera: Conjunctivae normal.     Pupils: Pupils are equal, round, and reactive to light.  Cardiovascular:     Heart sounds: Normal heart sounds.  Pulmonary:     Effort: Pulmonary effort is normal.  Abdominal:     Palpations: Abdomen is soft.  Musculoskeletal:        General: Normal range of motion.     Cervical back: Normal range of motion.  Skin:    General: Skin is warm and dry.  Neurological:     General: No focal deficit present.     Mental Status: She is alert.  Psychiatric:        Attention and Perception: Attention normal.        Mood and Affect: Mood is depressed.        Speech: Speech normal.        Behavior: Behavior normal.        Thought Content: Thought content normal. Thought content does not include suicidal  ideation.        Cognition and Memory: Cognition normal.        Judgment: Judgment normal.     Review of Systems  Constitutional: Positive for fatigue.  HENT: Negative.   Eyes: Negative.   Respiratory: Negative.   Cardiovascular: Negative.   Gastrointestinal: Negative.   Musculoskeletal: Negative.   Skin: Negative.   Neurological: Negative.   Psychiatric/Behavioral: Positive for dysphoric mood. Negative for suicidal ideas.    Blood pressure 97/70, pulse 97, temperature 98.8 F (37.1 C), temperature source Oral, resp. rate 18, height 5\' 1"  (1.549 m), weight 52.2 kg, last menstrual period 09/02/2019, SpO2 98 %.Body mass index is 21.74 kg/m.  General Appearance: Casual  Eye Contact:  Good  Speech:  Clear and Coherent  Volume:  Normal  Mood:  Euthymic  Affect:  Congruent  Thought Process:  Goal Directed  Orientation:  Full (Time, Place, and Person)  Thought Content:  Logical  Suicidal Thoughts:  No  Homicidal Thoughts:  No  Memory:  Immediate;   Fair Recent;   Fair Remote;   Fair  Judgement:  Fair  Insight:  Fair  Psychomotor Activity:  Normal  Concentration:  Concentration: Fair  Recall:  09/04/2019 of Knowledge:  Fair  Language:  Fair  Akathisia:  No  Handed:  Right  AIMS (if indicated):     Assets:  Desire for Improvement Financial Resources/Insurance Housing Physical Health Resilience Social Support Vocational/Educational  ADL's:  Intact  Cognition:  WNL  Sleep:  Number of Hours: 5    Treatment Plan Summary: Daily contact with patient to assess and evaluate symptoms and progress in treatment, Medication management and Plan 24 year old woman with single episode of moderate major depression.  Her chief complaint is fatigue.  She has a 57-year-old daughter at home and this is her first child.  The patient is also working third shift.  I explained to her how both of those were situations that can greatly increased fatigue.  I discussed depression especially  postpartum depression its symptoms and appropriate treatment.  Encourage patient to consider antidepressant medicine which she is already agreeable to.  In the emergency room she had been started on Cymbalta 30 mg a day.  I am going to increase that to the normal 60 mg a day dose.  Side effects reviewed.  Patient will be discharged and referred to outpatient treatment and strongly  encouraged to follow-up with therapy.  Observation Level/Precautions:  15 minute checks  Laboratory:  Chemistry Profile  Psychotherapy:    Medications:    Consultations:    Discharge Concerns:    Estimated LOS:  Other:     Physician Treatment Plan for Primary Diagnosis: Moderate major depression, single episode (HCC) Long Term Goal(s): Improvement in symptoms so as ready for discharge  Short Term Goals: Ability to verbalize feelings will improve  Physician Treatment Plan for Secondary Diagnosis: Principal Problem:   Moderate major depression, single episode (HCC)  Long Term Goal(s): Improvement in symptoms so as ready for discharge  Short Term Goals: Ability to maintain clinical measurements within normal limits will improve and Compliance with prescribed medications will improve  I certify that inpatient services furnished can reasonably be expected to improve the patient's condition.    Mordecai RasmussenJohn Amy Gothard, MD 9/21/202110:16 AM

## 2019-09-24 NOTE — [facility] Suicide Risk Assessment (Signed)
[facility] Discharge Suicide Risk Assessment   Principal Problem: Moderate major depression, single episode [facility]) Discharge Diagnoses: Principal Problem:   Moderate major depression, single episode (HCC)   Total Time spent with patient: 1 hour  Musculoskeletal: Strength & Muscle Tone: within normal limits Gait & Station: normal Patient leans: N/A  Psychiatric Specialty Exam: Review of Systems  Constitutional: Positive for fatigue.  HENT: Negative.   Eyes: Negative.   Respiratory: Negative.   Cardiovascular: Negative.   Gastrointestinal: Negative.   Musculoskeletal: Negative.   Skin: Negative.   Neurological: Negative.   Psychiatric/Behavioral: Positive for dysphoric mood. Negative for suicidal ideas.    Blood pressure 97/70, pulse 97, temperature 98.8 F (37.1 C), temperature source Oral, resp. rate 18, height 5\' 1"  (1.549 m), weight 52.2 kg, last menstrual period 09/02/2019, SpO2 98 %.Body mass index is 21.74 kg/m.  General Appearance: Casual  Eye Contact::  Good  Speech:  Clear and Coherent409  Volume:  Normal  Mood:  Dysphoric  Affect:  Congruent  Thought Process:  Goal Directed  Orientation:  Full (Time, Place, and Person)  Thought Content:  Logical  Suicidal Thoughts:  No  Homicidal Thoughts:  No  Memory:  Immediate;   Fair Recent;   Fair Remote;   Fair  Judgement:  Fair  Insight:  Fair  Psychomotor Activity:  Normal  Concentration:  Fair  Recall:  [phone] of Knowledge:Fair  Language: Fair  Akathisia:  No  Handed:  Right  AIMS (if indicated):     Assets:  Desire for Improvement Housing Physical Health Resilience Social Support  Sleep:  Number of Hours: 5  Cognition: WNL  ADL's:  Intact   Mental Status Per Nursing Assessment::   On Admission:  NA  Demographic Factors:  Caucasian  Loss Factors: NA  Historical Factors: NA  Risk Reduction Factors:   Responsible for children under 21 years of age, Sense of responsibility to family, Employed,  Living with another person, especially a relative, Positive social support and Positive therapeutic relationship  Continued Clinical Symptoms:  Depression:   Anhedonia  Cognitive Features That Contribute To Risk:  None    Suicide Risk:  Minimal: No identifiable suicidal ideation.  Patients presenting with no risk factors but with morbid ruminations; may be classified as minimal risk based on the severity of the depressive symptoms    Plan Of Care/Follow-up recommendations:  Activity:  Activity as tolerated Diet:  Regular diet Other:  Follow-up with outpatient treatment as recommended  15, MD 09/24/2019, 10:14 AM

## 2019-09-24 NOTE — [facility] Suicide Risk Assessment (Signed)
[facility] INPATIENT:  Family/Significant Other Suicide Prevention Education  Suicide Prevention Education:  Patient Refusal for Family/Significant Other Suicide Prevention Education: The patient Diane Wyatt has refused to provide written consent for family/significant other to be provided Family/Significant Other Suicide Prevention Education during admission and/or prior to discharge.  Physician notified.  SPE completed with pt, as pt refused to consent to family contact. SPI pamphlet provided to pt and pt was encouraged to share information with support network, ask questions, and talk about any concerns relating to SPE. Pt denies access to guns/firearms and verbalized understanding of information provided. Mobile Crisis information also provided to pt.   [place] Mo 09/24/2019, 12:31 PM

## 2019-09-24 NOTE — [facility] Suicide Risk Assessment (Signed)
[facility] Admission Suicide Risk Assessment   Nursing information obtained from:  Patient Demographic factors:  NA Current Mental Status:  NA Loss Factors:  NA Historical Factors:  NA Risk Reduction Factors:  NA  Total Time spent with patient: 1 hour Principal Problem: <principal problem not specified> Diagnosis:  Active Problems:   Severe recurrent major depression (HCC)  Subjective Data: Patient seen chart reviewed.  24 year old woman with no past psychiatric history presented to the emergency room with complaints of depression.  Denies any suicidal thoughts at all.  No evidence of psychosis.  No evidence of dangerousness.  Agreeable and cooperative with treatment no substance abuse  Continued Clinical Symptoms:  Alcohol Use Disorder Identification Test Final Score (AUDIT): 0 The "Alcohol Use Disorders Identification Test", Guidelines for Use in Primary Care, Second Edition.  World Science writer [facility]). Score between 0-7:  no or low risk or alcohol related problems. Score between 8-15:  moderate risk of alcohol related problems. Score between 16-19:  high risk of alcohol related problems. Score 20 or above:  warrants further diagnostic evaluation for alcohol dependence and treatment.   CLINICAL FACTORS:   Depression:   Anhedonia   Musculoskeletal: Strength & Muscle Tone: within normal limits Gait & Station: normal Patient leans: N/A  Psychiatric Specialty Exam: Physical Exam Vitals and nursing note reviewed.  Constitutional:      Appearance: She is well-developed.  HENT:     Head: Normocephalic and atraumatic.  Eyes:     Conjunctiva/sclera: Conjunctivae normal.     Pupils: Pupils are equal, round, and reactive to light.  Cardiovascular:     Heart sounds: Normal heart sounds.  Pulmonary:     Effort: Pulmonary effort is normal.  Abdominal:     Palpations: Abdomen is soft.  Musculoskeletal:        General: Normal range of motion.     Cervical back: Normal range of  motion.  Skin:    General: Skin is warm and dry.  Neurological:     General: No focal deficit present.     Mental Status: She is alert.  Psychiatric:        Attention and Perception: Attention normal.        Mood and Affect: Mood is depressed.        Speech: Speech normal.        Behavior: Behavior normal.        Thought Content: Thought content normal. Thought content does not include suicidal ideation.        Cognition and Memory: Cognition normal.        Judgment: Judgment normal.     Review of Systems  Constitutional: Positive for fatigue.  HENT: Negative.   Eyes: Negative.   Respiratory: Negative.   Cardiovascular: Negative.   Gastrointestinal: Negative.   Musculoskeletal: Negative.   Skin: Negative.   Neurological: Negative.   Psychiatric/Behavioral: Positive for dysphoric mood. Negative for behavioral problems, hallucinations, self-injury and suicidal ideas. The patient is not nervous/anxious.     Blood pressure 97/70, pulse 97, temperature 98.8 F (37.1 C), temperature source Oral, resp. rate 18, height 5\' 1"  (1.549 m), weight 52.2 kg, last menstrual period 09/02/2019, SpO2 98 %.Body mass index is 21.74 kg/m.  General Appearance: Negative  Eye Contact:  Negative  Speech:  Negative  Volume:  Normal  Mood:  Negative  Affect:  Negative  Thought Process:  Coherent  Orientation:  Negative  Thought Content:  Negative  Suicidal Thoughts:  No  Homicidal Thoughts:  No  Memory:  Negative  Judgement:  Negative  Insight:  Negative  Psychomotor Activity:  Negative  Concentration:  Concentration: Negative  Recall:  Negative  Fund of Knowledge:  Negative  Language:  Negative  Akathisia:  Negative  Handed:  Right  AIMS (if indicated):     Assets:  Desire for Improvement Housing Physical Health Resilience Social Support  ADL's:  Intact  Cognition:  WNL  Sleep:  Number of Hours: 5      COGNITIVE FEATURES THAT CONTRIBUTE TO RISK:  None    SUICIDE RISK:    Minimal: No identifiable suicidal ideation.  Patients presenting with no risk factors but with morbid ruminations; may be classified as minimal risk based on the severity of the depressive symptoms  PLAN OF CARE: Review depression with psychoeducation and review treatment plan and medicine management.  Patient will be discharged today with referral to outpatient treatment  I certify that inpatient services furnished can reasonably be expected to improve the patient's condition.   Mordecai Rasmussen, MD 09/24/2019, 10:08 AM

## 2019-09-24 NOTE — Discharge Summary (Signed)
Physician Discharge Summary Note  Patient:  Diane Wyatt is an 24 y.o., female MRN:  [id number] DOB:  1995/07/11 Patient phone:  [phone] (home)  Patient address:   [address] [place] [address],  Total Time spent with patient: 1 hour  Date of Admission:  09/23/2019 Date of Discharge: 09/24/2019  Reason for Admission: Patient admitted because of single episode of major depression for evaluation and treatment planning  Principal Problem: Moderate major depression, single episode [facility]) Discharge Diagnoses: Principal Problem:   Moderate major depression, single episode [facility])   Past Psychiatric History: No prior hospitalization.  No history of suicide attempts.  Has been on antidepressant medicine for a couple weeks without improvement  Past Medical History:  Past Medical History:  Diagnosis Date  . Back injury   . Medical history non-contributory     Past Surgical History:  Procedure Laterality Date  . HERNIA REPAIR    . NO PAST SURGERIES     Family History:  Family History  Problem Relation Age of Onset  . Healthy Mother   . Healthy Father    Family Psychiatric  History: Positive for depression.  No family history of suicide Social History:  Social History   Substance and Sexual Activity  Alcohol Use Never     Social History   Substance and Sexual Activity  Drug Use Never    Social History   Socioeconomic History  . Marital status: Single    Spouse name: Not on file  . Number of children: Not on file  . Years of education: Not on file  . Highest education level: Not on file  Occupational History  . Not on file  Tobacco Use  . Smoking status: Never Smoker  . Smokeless tobacco: Never Used  Vaping Use  . Vaping Use: Never used  Substance and Sexual Activity  . Alcohol use: Never  . Drug use: Never  . Sexual activity: Yes    Birth control/protection: Pill  Other Topics Concern  . Not on file  Social History Narrative  . Not on  file   Social Determinants of Health   Financial Resource Strain:   . Difficulty of Paying Living Expenses: Not on file  Food Insecurity:   . Worried About Programme researcher, broadcasting/film/video in the Last Year: Not on file  . Ran Out of Food in the Last Year: Not on file  Transportation Needs:   . Lack of Transportation (Medical): Not on file  . Lack of Transportation (Non-Medical): Not on file  Physical Activity:   . Days of Exercise per Week: Not on file  . Minutes of Exercise per Session: Not on file  Stress:   . Feeling of Stress : Not on file  Social Connections:   . Frequency of Communication with Friends and Family: Not on file  . Frequency of Social Gatherings with Friends and Family: Not on file  . Attends Religious Services: Not on file  . Active Member of Clubs or Organizations: Not on file  . Attends Banker Meetings: Not on file  . Marital Status: Not on file    Hospital Course: Admitted to the psychiatric unit.  Continued on 15-minute checks.  Engaged in individual and group assessment and attended treatment team.  Patient had been started on Cymbalta in the emergency room.  We reviewed the use of antidepressants for treating depression.  Dose of Cymbalta will be increased to 60 mg for full effect.  At this point the patient  does not require inpatient hospitalization.  There is no indication of dangerousness or of psychosis.  She is requesting discharge.  Patient will be discharged with prescription for Cymbalta and vitamins and encouraged to follow-up with therapy and medicine management.  Psychoeducation completed.  Physical Findings: AIMS:  , ,  ,  ,    CIWA:    COWS:     Musculoskeletal: Strength & Muscle Tone: within normal limits Gait & Station: normal Patient leans: N/A  Psychiatric Specialty Exam: Physical Exam Vitals and nursing note reviewed.  Constitutional:      Appearance: She is well-developed.  HENT:     Head: Normocephalic and atraumatic.  Eyes:      Conjunctiva/sclera: Conjunctivae normal.     Pupils: Pupils are equal, round, and reactive to light.  Cardiovascular:     Heart sounds: Normal heart sounds.  Pulmonary:     Effort: Pulmonary effort is normal.  Abdominal:     Palpations: Abdomen is soft.  Musculoskeletal:        General: Normal range of motion.     Cervical back: Normal range of motion.  Skin:    General: Skin is warm and dry.  Neurological:     General: No focal deficit present.     Mental Status: She is alert.  Psychiatric:        Attention and Perception: Attention normal.        Mood and Affect: Mood is depressed.        Speech: Speech normal.        Behavior: Behavior normal.        Thought Content: Thought content normal.        Cognition and Memory: Cognition normal.        Judgment: Judgment normal.     Review of Systems  Constitutional: Positive for fatigue.  HENT: Negative.   Eyes: Negative.   Respiratory: Negative.   Cardiovascular: Negative.   Gastrointestinal: Negative.   Musculoskeletal: Negative.   Skin: Negative.   Neurological: Negative.   Psychiatric/Behavioral: Positive for dysphoric mood. Negative for suicidal ideas.    Blood pressure 97/70, pulse 97, temperature 98.8 F (37.1 C), temperature source Oral, resp. rate 18, height 5\' 1"  (1.549 m), weight 52.2 kg, last menstrual period 09/02/2019, SpO2 98 %.Body mass index is 21.74 kg/m.  General Appearance: Casual  Eye Contact:  Good  Speech:  Clear and Coherent  Volume:  Normal  Mood:  Euthymic  Affect:  Congruent  Thought Process:  Goal Directed  Orientation:  Full (Time, Place, and Person)  Thought Content:  Logical  Suicidal Thoughts:  No  Homicidal Thoughts:  No  Memory:  Immediate;   Fair Recent;   Fair Remote;   Fair  Judgement:  Fair  Insight:  Good  Psychomotor Activity:  Normal  Concentration:  Concentration: Fair  Recall:  09/04/2019 of Knowledge:  Fair  Language:  Fair  Akathisia:  No  Handed:  Right   AIMS (if indicated):     Assets:  Desire for Improvement  ADL's:  Intact  Cognition:  WNL  Sleep:  Number of Hours: 5     Have you used any form of tobacco in the last 30 days? (Cigarettes, Smokeless Tobacco, Cigars, and/or Pipes): No  Has this patient used any form of tobacco in the last 30 days? (Cigarettes, Smokeless Tobacco, Cigars, and/or Pipes) Yes, No  Blood Alcohol level:  Lab Results  Component Value Date   ETH <10 09/22/2019  Metabolic Disorder Labs:  No results found for: HGBA1C, MPG No results found for: PROLACTIN No results found for: CHOL, TRIG, HDL, CHOLHDL, VLDL, LDLCALC  See Psychiatric Specialty Exam and Suicide Risk Assessment completed by Attending Physician prior to discharge.  Discharge destination:  Home  Is patient on multiple antipsychotic therapies at discharge:  No   Has Patient had three or more failed trials of antipsychotic monotherapy by history:  No  Recommended Plan for Multiple Antipsychotic Therapies: NA  Discharge Instructions    Diet - low sodium heart healthy   Complete by: As directed    Increase activity slowly   Complete by: As directed      Allergies as of 09/24/2019      Reactions   Doxycycline Hives      Medication List    TAKE these medications     Indication  DULoxetine 60 MG capsule Commonly known as: CYMBALTA Take 1 capsule (60 mg total) by mouth daily. Start taking on: September 25, 2019  Indication: Major Depressive Disorder   multivitamin with minerals Tabs tablet Take 1 tablet by mouth daily. Start taking on: September 25, 2019  Indication: Postpartum vitamin deficiency   norgestimate-ethinyl estradiol 0.25-35 MG-MCG tablet Commonly known as: ORTHO-CYCLEN Take by mouth.  Indication: Birth Control Treatment        Follow-up recommendations:  Activity:  Activity as tolerated Diet:  Regular diet Other:  Follow-up with outpatient treatment as indicated  Comments: Prescription provided at  discharge  Signed: Mordecai Rasmussen, MD 09/24/2019, 10:23 AM

## 2019-09-24 NOTE — Progress Notes (Signed)
Recreation Therapy Notes  Date: 09/24/2019  Time: 9:30 am   Location: Craft room     Behavioral response: N/A   Intervention Topic: Happiness   Discussion/Intervention: Patient did not attend group.   Clinical Observations/Feedback:  Patient did not attend group.   Shantel Helwig LRT/CTRS         Lanny Lipkin 09/24/2019 12:00 PM

## 2019-09-24 NOTE — Progress Notes (Addendum)
Admission Note:  24 yr Female who presents Voluntary Commitment, she is alert and oriented x 4, she presents in no acute distress for the treatment of Depression. Patient appears flat and sad, she was calm and cooperative with admission process, she currently denies SI/HI/AVH and contracts for safety upon admission. Patient explained to writer that she was diagnosed with post partum depression after the birth ofher baby. Patient has a 5 month baby and she feels the depression has worsened over the past  few month, anhedonia and l her not caring for the baby as she should.   Patient has Past medical Hx of  Depression, and Back injury. Patient's skin was assessed and found to be clear, warm and intact, also she was searched and no contraband found, POC and unit policies explained,  understanding verbalized and consents obtained.patient was oriented to the unit , 15 minutes safety checks maintained will continue to monitor.

## 2019-09-24 NOTE — Tx Team (Signed)
Initial Treatment Plan 09/24/2019 1:10 AM Jeanann Lewandowsky [id number]    PATIENT STRESSORS: Medication change or noncompliance   PATIENT STRENGTHS: Ability for insight Motivation for treatment/growth Supportive family/friends   PATIENT IDENTIFIED PROBLEMS: Depression                      DISCHARGE CRITERIA:  Improved stabilization in mood, thinking, and/or behavior Motivation to continue treatment in a less acute level of care  PRELIMINARY DISCHARGE PLAN: Outpatient therapy  PATIENT/FAMILY INVOLVEMENT: This treatment plan has been presented to and reviewed with the patient, Lariza Cothron, The patient and family have been given the opportunity to ask questions and make suggestions.  Governor Specking Kensi Karr, RN 09/24/2019, 1:10 AM

## 2019-09-24 NOTE — Progress Notes (Signed)
D: Pt alert and oriented. Pt denies experiencing any pain, SI/HI, or AVH at this time. Pt reports she will be able to keep herself safe when she returns home.   A: Pt received discharge and medication education/information. Pt belongings were returned and signed for at this time to included printed prescriptions.   R: Pt verbalized understanding of discharge and medication education/information.  Pt escorted by staff to medical mall front lobby where pt's fiance picked her up.

## 2019-09-24 NOTE — [facility]HH Counselor (Signed)
Adult Comprehensive Assessment  Patient ID: Diane Wyatt, female   DOB: [birth year], 24 y.o.   MRN: [id number]  Information Source:    Current Stressors:     Living/Environment/Situation:  Living Arrangements: Spouse/significant other, Children, Other relatives  Family History:     Childhood History:     Education:     Employment/Work Situation:      Surveyor, quantity Resources:      Alcohol/Substance Abuse:      Social Support System:      Leisure/Recreation:      Strengths/Needs:      Discharge Plan:      Summary/Recommendations:   Emergency planning/management officer and Recommendations (to be completed by the evaluator): Patient is a 24 year old female from [place], [place] [facility]   She reports to the hospital for concerns for postpartum depression.  She has a has a primary diagnosis of Major Depressive Disorder, moderate.  Recommendations include: crisis stabilization, therapeutic milieu, encourage group attendance and participation, medication management for detox/mood stabilization and development of comprehensive mental wellness/sobriety plan.  Diane Wyatt. 09/24/2019

## 2019-10-12 ENCOUNTER — Encounter: Payer: Self-pay | Admitting: Emergency Medicine

## 2019-10-12 ENCOUNTER — Emergency Department
Admission: EM | Admit: 2019-10-12 | Discharge: 2019-10-12 | Disposition: A | Discharge status: Home / Self Care | Payer: Medicaid Other | Attending: Emergency Medicine | Admitting: Emergency Medicine

## 2019-10-12 ENCOUNTER — Other Ambulatory Visit: Payer: Self-pay

## 2019-10-12 DIAGNOSIS — U071 COVID-19: Secondary | ICD-10-CM | POA: Insufficient documentation

## 2019-10-12 DIAGNOSIS — E860 Dehydration: Secondary | ICD-10-CM | POA: Diagnosis not present

## 2019-10-12 DIAGNOSIS — Z20822 Contact with and (suspected) exposure to covid-19: Secondary | ICD-10-CM

## 2019-10-12 DIAGNOSIS — R430 Anosmia: Secondary | ICD-10-CM | POA: Diagnosis present

## 2019-10-12 LAB — RESPIRATORY PANEL BY RT PCR (FLU A&B, COVID)
Influenza A by PCR: NEGATIVE
Influenza B by PCR: NEGATIVE
SARS Coronavirus 2 by RT PCR: POSITIVE — AB

## 2019-10-12 LAB — CBC WITH DIFFERENTIAL/PLATELET
Abs Immature Granulocytes: 0.02 10*3/uL (ref 0.00–0.07)
Basophils Absolute: 0 10*3/uL (ref 0.0–0.1)
Basophils Relative: 0 %
Eosinophils Absolute: 0 10*3/uL (ref 0.0–0.5)
Eosinophils Relative: 0 %
HCT: 34.7 % — ABNORMAL LOW (ref 36.0–46.0)
Hemoglobin: 10.8 g/dL — ABNORMAL LOW (ref 12.0–15.0)
Immature Granulocytes: 1 %
Lymphocytes Relative: 27 %
Lymphs Abs: 1.1 10*3/uL (ref 0.7–4.0)
MCH: 26 pg (ref 26.0–34.0)
MCHC: 31.1 g/dL (ref 30.0–36.0)
MCV: 83.4 fL (ref 80.0–100.0)
Monocytes Absolute: 0.2 10*3/uL (ref 0.1–1.0)
Monocytes Relative: 5 %
Neutro Abs: 2.8 10*3/uL (ref 1.7–7.7)
Neutrophils Relative %: 67 %
Platelets: 201 10*3/uL (ref 150–400)
RBC: 4.16 MIL/uL (ref 3.87–5.11)
RDW: 16.1 % — ABNORMAL HIGH (ref 11.5–15.5)
WBC: 4.2 10*3/uL (ref 4.0–10.5)
nRBC: 0 % (ref 0.0–0.2)

## 2019-10-12 LAB — COMPREHENSIVE METABOLIC PANEL
ALT: 12 U/L (ref 0–44)
AST: 22 U/L (ref 15–41)
Albumin: 4.2 g/dL (ref 3.5–5.0)
Alkaline Phosphatase: 40 U/L (ref 38–126)
Anion gap: 8 (ref 5–15)
BUN: 15 mg/dL (ref 6–20)
CO2: 24 mmol/L (ref 22–32)
Calcium: 8.6 mg/dL — ABNORMAL LOW (ref 8.9–10.3)
Chloride: 101 mmol/L (ref 98–111)
Creatinine, Ser: 1.15 mg/dL — ABNORMAL HIGH (ref 0.44–1.00)
GFR, Estimated: >60 mL/min (ref >60)
Glucose, Bld: 147 mg/dL — ABNORMAL HIGH (ref 70–99)
Potassium: 3.4 mmol/L — ABNORMAL LOW (ref 3.5–5.1)
Sodium: 133 mmol/L — ABNORMAL LOW (ref 135–145)
Total Bilirubin: 0.5 mg/dL (ref 0.3–1.2)
Total Protein: 7.2 g/dL (ref 6.5–8.1)

## 2019-10-12 MED ORDER — ONDANSETRON HCL 4 MG PO TABS: 4.0000 mg | ORAL_TABLET | Freq: Three times a day (TID) | ORAL | 0 refills | Status: AC | PRN

## 2019-10-12 MED ORDER — SODIUM CHLORIDE 0.9 % IV BOLUS
1000.0000 mL | Freq: Once | INTRAVENOUS | Status: AC
  Administered 2019-10-12: 1000 mL via INTRAVENOUS

## 2019-10-12 MED ORDER — ALBUTEROL SULFATE HFA 108 (90 BASE) MCG/ACT IN AERS: 2.0000 | INHALATION_SPRAY | Freq: Four times a day (QID) | RESPIRATORY_TRACT | 0 refills | Status: AC | PRN

## 2019-10-12 NOTE — ED Provider Notes (Signed)
[facility] Emergency Department Provider Note   ____________________________________________   I have reviewed the triage vital signs and the nursing notes.   HISTORY  Chief Complaint loss of smell   History limited by: Not Limited   HPI Diane Wyatt is a 24 y.o. female who presents to the emergency department today because of concern for possible COVID-19 infection. The patient states that she started to feel bad 3 days ago. She did have fever, sinus congestion. She has had loss of smell. The patient had known positive exposure. The patient denies any underlying or chronic lung disease. Denies any current shortness of breath. Has been trying to keep up with her oral intake.   Records reviewed. Per medical record review patient has a history of depression, back injury. .  Past Medical History:  Diagnosis Date  . Back injury   . Medical history non-contributory     Patient Active Problem List   Diagnosis Date Noted  . Moderate major depression, single episode (HCC) 09/24/2019  . Pregnancy 03/06/2019  . Indication for care in labor and delivery, antepartum 01/19/2019    Past Surgical History:  Procedure Laterality Date  . HERNIA REPAIR    . NO PAST SURGERIES      Prior to Admission medications   Medication Sig Start Date End Date Taking? Authorizing Provider  DULoxetine (CYMBALTA) 60 MG capsule Take 1 capsule (60 mg total) by mouth daily. 09/25/19   Clapacs, Jackquline Denmark, MD  Multiple Vitamin (MULTIVITAMIN WITH MINERALS) TABS tablet Take 1 tablet by mouth daily. 09/25/19   Clapacs, Jackquline Denmark, MD  norgestimate-ethinyl estradiol (ORTHO-CYCLEN) 0.25-35 MG-MCG tablet Take by mouth. 04/09/19 04/08/20  [provider]    Allergies Doxycycline  Family History  Problem Relation Age of Onset  . Healthy Mother   . Healthy Father     Social History Social History   Tobacco Use  . Smoking status: Never Smoker  . Smokeless tobacco: Never Used   Vaping Use  . Vaping Use: Never used  Substance Use Topics  . Alcohol use: Never  . Drug use: Never    Review of Systems Constitutional: Positive for fever.  Eyes: No visual changes. ENT: Positive for congestion.  Cardiovascular: Denies chest pain. Respiratory: Denies shortness of breath. Gastrointestinal: No abdominal pain.  No nausea, no vomiting.  No diarrhea.   Genitourinary: Negative for dysuria. Musculoskeletal: Negative for back pain. Skin: Negative for rash. Neurological: Positive for change in smell. Positive for dizziness.  ____________________________________________   PHYSICAL EXAM:  VITAL SIGNS: ED Triage Vitals  Enc Vitals Group     BP 10/12/19 1527 (!) 65/46     Pulse Rate 10/12/19 1527 92     Resp 10/12/19 1527 18     Temp 10/12/19 1527 (!) 100.5 F (38.1 C)     Temp Source 10/12/19 1527 Oral     SpO2 10/12/19 1527 100 %     Weight 10/12/19 1511 120 lb (54.4 kg)     Height 10/12/19 1511 5\' 1"  (1.549 m)     Head Circumference --      Peak Flow --      Pain Score 10/12/19 1511 0   Constitutional: Alert and oriented.  Eyes: Conjunctivae are normal.  ENT      Head: Normocephalic and atraumatic.      Nose: No congestion/rhinnorhea.      Mouth/Throat: Mucous membranes are moist.      Neck: No stridor. Hematological/Lymphatic/Immunilogical: No cervical lymphadenopathy. Cardiovascular: Normal rate,  regular rhythm.  No murmurs, rubs, or gallops.  Respiratory: Normal respiratory effort without tachypnea nor retractions. Breath sounds are clear and equal bilaterally. No wheezes/rales/rhonchi. Gastrointestinal: Soft and non tender. No rebound. No guarding.  Genitourinary: Deferred Musculoskeletal: Normal range of motion in all extremities. No lower extremity edema. Neurologic:  Normal speech and language. No gross focal neurologic deficits are appreciated.  Skin:  Skin is warm, dry and intact. No rash noted. Psychiatric: Mood and affect are normal. Speech  and behavior are normal. Patient exhibits appropriate insight and judgment.  ____________________________________________    LABS (pertinent positives/negatives)  CBC wbc 4.2, hgb 10.8, plt 201 CMP na 133, k 3.4, glu 147, cr 1.15 ____________________________________________   EKG  None  ____________________________________________    RADIOLOGY  None  ____________________________________________   PROCEDURES  Procedures  ____________________________________________   INITIAL IMPRESSION / ASSESSMENT AND PLAN / ED COURSE  Pertinent labs & imaging results that were available during my care of the patient were reviewed by me and considered in my medical decision making (see chart for details).   Patient presented to the emergency department today because of concern for possible COVID 19. Patient had known exposure. Does have symptoms consistent with covid. Patient is not hypoxic. Blood work consistent with some dehydration. Patient was given 1L IVFs. Hypotension did resolve quickly. I do wonder if it was partly vasovagal. Will send covid swab. Will give patient prescription for nausea medication and albuterol inhaler if she develops the need for them.   ____________________________________________   FINAL CLINICAL IMPRESSION(S) / ED DIAGNOSES  Final diagnoses:  Suspected COVID-19 virus infection  Dehydration     Note: This dictation was prepared with Dragon dictation. Any transcriptional errors that result from this process are unintentional     Phineas Semen, MD 10/12/19 [phone]

## 2019-10-12 NOTE — ED Notes (Signed)
RN walked pt from first nurse desk to middle to check vital signs. Pt informed RN that she was feeling very dizzy all of a sudden. Pt states that she feels like she is going to pass out. Pt BP checked and pt found to be hypotensive in the 60's. Pt placed in recline and IVF started. Charge nurse was called and pt taken to treatment room.

## 2019-10-12 NOTE — Discharge Instructions (Addendum)
Please seek medical attention for any high fevers, chest pain, shortness of breath, change in behavior, persistent vomiting, bloody stool or any other new or concerning symptoms.  

## 2019-10-12 NOTE — ED Triage Notes (Signed)
Pt to ED via POV, pt states that she has been having cold symptoms for the past few days. Pt mother + for COVID. Pt has loss of taste and smell. Pt is in NAD.

## 2019-10-12 NOTE — ED Triage Notes (Signed)
FIRST NURSE NOTE: Pt reports cold sxs, wants COVID test.

## 2019-10-13 ENCOUNTER — Telehealth (HOSPITAL_COMMUNITY): Payer: Self-pay

## 2019-10-13 ENCOUNTER — Telehealth (HOSPITAL_COMMUNITY): Payer: Self-pay | Admitting: Family

## 2019-10-13 DIAGNOSIS — U071 COVID-19: Secondary | ICD-10-CM

## 2019-10-13 NOTE — Telephone Encounter (Signed)
Called to discuss with Jeanann Lewandowsky about Covid symptoms and potential candidacy for the use of casirivimab/imdevimab, a combination monoclonal antibody infusion for those with mild to moderate Covid symptoms and at a high risk of hospitalization.     Unsure if patient is qualified for this infusion at the infusion center due to co-morbid conditions and/or a member of an at-risk group, no risk factors were readily noted, however unable to reach patient to discuss possible candidacy. Unable to leave VM as "this person has a VM box that has not been set up".    Kiriana Worthington,NP

## 2019-10-14 ENCOUNTER — Telehealth: Payer: Self-pay | Admitting: Emergency Medicine

## 2019-10-14 NOTE — Telephone Encounter (Signed)
Called patient to assure she is aware of covid result positive.  She says she is aware as the achd called her.  I told her that the infusion clinic has called her as well.  I told her she can call them if she is interested in more information about infusion.  She said okay.

## 2019-12-03 NOTE — Telephone Encounter (Signed)
done

## 2020-08-06 ENCOUNTER — Other Ambulatory Visit: Payer: Self-pay

## 2020-08-06 ENCOUNTER — Emergency Department
Admission: EM | Admit: 2020-08-06 | Discharge: 2020-08-06 | Disposition: A | Discharge status: Home / Self Care | Payer: Medicaid Other | Attending: Emergency Medicine | Admitting: Emergency Medicine

## 2020-08-06 DIAGNOSIS — Z5321 Procedure and treatment not carried out due to patient leaving prior to being seen by health care provider: Secondary | ICD-10-CM | POA: Insufficient documentation

## 2020-08-06 DIAGNOSIS — R42 Dizziness and giddiness: Secondary | ICD-10-CM | POA: Insufficient documentation

## 2020-08-06 DIAGNOSIS — R519 Headache, unspecified: Secondary | ICD-10-CM | POA: Insufficient documentation

## 2020-08-06 LAB — CBC
HCT: 37.2 % (ref 36.0–46.0)
Hemoglobin: 12.2 g/dL (ref 12.0–15.0)
MCH: 29.7 pg (ref 26.0–34.0)
MCHC: 32.8 g/dL (ref 30.0–36.0)
MCV: 90.5 fL (ref 80.0–100.0)
Platelets: 154 10*3/uL (ref 150–400)
RBC: 4.11 MIL/uL (ref 3.87–5.11)
RDW: 13.7 % (ref 11.5–15.5)
WBC: 4.8 10*3/uL (ref 4.0–10.5)
nRBC: 0 % (ref 0.0–0.2)

## 2020-08-06 LAB — URINALYSIS, COMPLETE (UACMP) WITH MICROSCOPIC
Bacteria, UA: NONE SEEN
Bilirubin Urine: NEGATIVE
Glucose, UA: NEGATIVE mg/dL
Hgb urine dipstick: NEGATIVE
Ketones, ur: NEGATIVE mg/dL
Nitrite: NEGATIVE
Protein, ur: NEGATIVE mg/dL
Specific Gravity, Urine: 1.015 (ref 1.005–1.030)
pH: 6 (ref 5.0–8.0)

## 2020-08-06 LAB — BASIC METABOLIC PANEL
Anion gap: 7 (ref 5–15)
BUN: 20 mg/dL (ref 6–20)
CO2: 24 mmol/L (ref 22–32)
Calcium: 9.6 mg/dL (ref 8.9–10.3)
Chloride: 106 mmol/L (ref 98–111)
Creatinine, Ser: 0.87 mg/dL (ref 0.44–1.00)
GFR, Estimated: >60 mL/min (ref >60)
Glucose, Bld: 97 mg/dL (ref 70–99)
Potassium: 3.9 mmol/L (ref 3.5–5.1)
Sodium: 137 mmol/L (ref 135–145)

## 2020-08-06 LAB — POC URINE PREG, ED: Preg Test, Ur: NEGATIVE

## 2020-08-06 NOTE — ED Triage Notes (Signed)
Pt states she has been taking lexapro for a few months and then this AM she started feeling like she did before she went on the medication- states dizziness and lightheadedness- pt states she takes the lexapro for post partum depression

## 2020-10-21 ENCOUNTER — Emergency Department
Admission: EM | Admit: 2020-10-21 | Discharge: 2020-10-21 | Disposition: A | Discharge status: Home / Self Care | Payer: Medicaid Other | Attending: Emergency Medicine | Admitting: Emergency Medicine

## 2020-10-21 ENCOUNTER — Emergency Department: Payer: Medicaid Other

## 2020-10-21 ENCOUNTER — Other Ambulatory Visit: Payer: Self-pay

## 2020-10-21 DIAGNOSIS — R079 Chest pain, unspecified: Secondary | ICD-10-CM | POA: Diagnosis present

## 2020-10-21 DIAGNOSIS — Z79899 Other long term (current) drug therapy: Secondary | ICD-10-CM | POA: Diagnosis not present

## 2020-10-21 DIAGNOSIS — R0789 Other chest pain: Secondary | ICD-10-CM | POA: Insufficient documentation

## 2020-10-21 DIAGNOSIS — R059 Cough, unspecified: Secondary | ICD-10-CM | POA: Diagnosis not present

## 2020-10-21 LAB — BASIC METABOLIC PANEL
Anion gap: 8 (ref 5–15)
BUN: 16 mg/dL (ref 6–20)
CO2: 26 mmol/L (ref 22–32)
Calcium: 9.5 mg/dL (ref 8.9–10.3)
Chloride: 107 mmol/L (ref 98–111)
Creatinine, Ser: 0.78 mg/dL (ref 0.44–1.00)
GFR, Estimated: >60 mL/min (ref >60)
Glucose, Bld: 69 mg/dL — ABNORMAL LOW (ref 70–99)
Potassium: 3.6 mmol/L (ref 3.5–5.1)
Sodium: 141 mmol/L (ref 135–145)

## 2020-10-21 LAB — D-DIMER, QUANTITATIVE: D-Dimer, Quant: <0.27 ug/mL-FEU (ref 0.00–0.50)

## 2020-10-21 LAB — CBC
HCT: 39.5 % (ref 36.0–46.0)
Hemoglobin: 12.8 g/dL (ref 12.0–15.0)
MCH: 29.6 pg (ref 26.0–34.0)
MCHC: 32.4 g/dL (ref 30.0–36.0)
MCV: 91.4 fL (ref 80.0–100.0)
Platelets: 191 10*3/uL (ref 150–400)
RBC: 4.32 MIL/uL (ref 3.87–5.11)
RDW: 13.5 % (ref 11.5–15.5)
WBC: 10.2 10*3/uL (ref 4.0–10.5)
nRBC: 0 % (ref 0.0–0.2)

## 2020-10-21 LAB — POC URINE PREG, ED: Preg Test, Ur: NEGATIVE

## 2020-10-21 LAB — TROPONIN I (HIGH SENSITIVITY): Troponin I (High Sensitivity): <2 ng/L (ref <18)

## 2020-10-21 MED ORDER — NAPROXEN 500 MG PO TABS: 500.0000 mg | ORAL_TABLET | Freq: Two times a day (BID) | ORAL | 2 refills | Status: AC

## 2020-10-21 MED ORDER — KETOROLAC TROMETHAMINE 30 MG/ML IJ SOLN
30.0000 mg | Freq: Once | INTRAMUSCULAR | Status: AC
  Administered 2020-10-21: 30 mg via INTRAMUSCULAR
  Filled 2020-10-21: qty 1

## 2020-10-21 NOTE — ED Provider Notes (Signed)
[facility] Emergency Department Provider Note   ____________________________________________    I have reviewed the triage vital signs and the nursing notes.   HISTORY  Chief Complaint Cough and Chest Pain     HPI Diane Wyatt is a 25 y.o. female who presents with complaints of cough and chest pain.  Patient describes earlier today she was driving back from picking up her grandmother's medications when she felt several brief sharp pains in her chest just lateral to her sternum.  She reports it is worse when she takes deep breath.  It is happened intermittently since earlier today.  No fevers or chills.  No shortness of breath.  She is on Depo-Provera, no history of DVT, no calf pain or swelling.  Past Medical History:  Diagnosis Date   Back injury    Medical history non-contributory     Patient Active Problem List   Diagnosis Date Noted   Moderate major depression, single episode (HCC) 09/24/2019   Pregnancy 03/06/2019   Indication for care in labor and delivery, antepartum 01/19/2019    Past Surgical History:  Procedure Laterality Date   HERNIA REPAIR     NO PAST SURGERIES      Prior to Admission medications   Medication Sig Start Date End Date Taking? Authorizing Provider  naproxen (NAPROSYN) 500 MG tablet Take 1 tablet (500 mg total) by mouth 2 (two) times daily with a meal. 10/21/20  Yes Jene Every, MD  albuterol (VENTOLIN HFA) 108 (90 Base) MCG/ACT inhaler Inhale 2 puffs into the lungs every 6 (six) hours as needed for wheezing or shortness of breath. 10/12/19   Phineas Semen, MD  DULoxetine (CYMBALTA) 60 MG capsule Take 1 capsule (60 mg total) by mouth daily. 09/25/19   Clapacs, Jackquline Denmark, MD  Multiple Vitamin (MULTIVITAMIN WITH MINERALS) TABS tablet Take 1 tablet by mouth daily. 09/25/19   Clapacs, Jackquline Denmark, MD  norgestimate-ethinyl estradiol (ORTHO-CYCLEN) 0.25-35 MG-MCG tablet Take by mouth. 04/09/19 04/08/20  [provider]   ondansetron (ZOFRAN) 4 MG tablet Take 1 tablet (4 mg total) by mouth every 8 (eight) hours as needed. 10/12/19   Phineas Semen, MD     Allergies Doxycycline  Family History  Problem Relation Age of Onset   Healthy Mother    Healthy Father     Social History Social History   Tobacco Use   Smoking status: Never   Smokeless tobacco: Never  Vaping Use   Vaping Use: Never used  Substance Use Topics   Alcohol use: Never   Drug use: Never    Review of Systems  Constitutional: No fever/chills Eyes: No visual changes.  ENT: No sore throat. Cardiovascular: As above Respiratory: Denies shortness of breath. Gastrointestinal: No abdominal pain.  No nausea, no vomiting.   Genitourinary: Negative for dysuria. Musculoskeletal: Negative for back pain. Skin: Negative for rash. Neurological: Negative for headaches or weakness   ____________________________________________   PHYSICAL EXAM:  VITAL SIGNS: ED Triage Vitals  Enc Vitals Group     BP 10/21/20 1756 (!) 128/98     Pulse Rate 10/21/20 1756 (!) 118     Resp 10/21/20 1756 18     Temp 10/21/20 1756 98.3 F (36.8 C)     Temp Source 10/21/20 1756 Oral     SpO2 10/21/20 1756 97 %     Weight 10/21/20 1757 59.9 kg (132 lb)     Height 10/21/20 1757 1.549 m (5\' 1" )     Head Circumference --  Peak Flow --      Pain Score 10/21/20 1757 5     Pain Loc --      Pain Edu? --      Excl. in GC? --     Constitutional: Alert and oriented. No acute distress.  Nose: No congestion/rhinnorhea. Mouth/Throat: Mucous membranes are moist.   Neck:  Painless ROM Cardiovascular: Tachycardia regular rhythm. Grossly normal heart sounds.  Good peripheral circulation.  Patient has point tenderness to the left mid sternum which reproduces her pain exactly, no bony abnormality, no bruising, no rash or fluctuance Respiratory: Normal respiratory effort.  No retractions. Lungs CTAB.  Musculoskeletal:   Warm and well perfused Neurologic:   Normal speech and language. No gross focal neurologic deficits are appreciated.  Skin:  Skin is warm, dry and intact. No rash noted. Psychiatric: Mood and affect are normal. Speech and behavior are normal.  ____________________________________________   LABS (all labs ordered are listed, but only abnormal results are displayed)  Labs Reviewed  BASIC METABOLIC PANEL - Abnormal; Notable for the following components:      Result Value   Glucose, Bld 69 (*)    All other components within normal limits  CBC  D-DIMER, QUANTITATIVE  POC URINE PREG, ED  TROPONIN I (HIGH SENSITIVITY)  TROPONIN I (HIGH SENSITIVITY)   ____________________________________________  EKG  ED ECG REPORT I, Jene Every, the attending physician, personally viewed and interpreted this ECG.  Date: 10/21/2020  Rhythm: Sinus tachycardia QRS Axis: normal Intervals: normal ST/T Wave abnormalities: normal Narrative Interpretation: no evidence of acute ischemia  ____________________________________________  RADIOLOGY  Chest x-ray reviewed by me, no acute abnormality ____________________________________________   PROCEDURES  Procedure(s) performed: No  Procedures   Critical Care performed: No ____________________________________________   INITIAL IMPRESSION / ASSESSMENT AND PLAN / ED COURSE  Pertinent labs & imaging results that were available during my care of the patient were reviewed by me and considered in my medical decision making (see chart for details).   Patient well-appearing and in no acute distress.  She has point tenderness to her chest wall most consistent with costochondritis.  She is low risk for ACS, dissection or PE.  D-dimer is less than 0.27.  Chest x-ray no evidence of pneumonia or pneumothorax  White blood cell count is normal, high sensitive troponin is normal.  Patient is relieved at reassuring work-up, appropriate for outpatient follow-up, return precautions discussed     ____________________________________________   FINAL CLINICAL IMPRESSION(S) / ED DIAGNOSES  Final diagnoses:  Chest wall pain        Note:  This document was prepared using Dragon voice recognition software and may include unintentional dictation errors.    Jene Every, MD 10/21/20 2121

## 2020-10-21 NOTE — ED Triage Notes (Signed)
Pt states that's he started coughing yesterday, states that they are here seasonal allergies, states that she suddenly started having a sharp pain on the left side of her chest, pt states that she feels like she has been running a marathon but hasn't been doing anything, in triage the pt's heart rate has ranged from 112-138 with a good waveform. Pt denies any arrhythmia hx or P.E. hx

## 2020-10-28 ENCOUNTER — Other Ambulatory Visit: Payer: Self-pay

## 2020-10-28 ENCOUNTER — Emergency Department
Admission: EM | Admit: 2020-10-28 | Discharge: 2020-10-28 | Disposition: A | Discharge status: Home / Self Care | Payer: Medicaid Other | Attending: Student in an Organized Health Care Education/Training Program | Admitting: Student in an Organized Health Care Education/Training Program

## 2020-10-28 ENCOUNTER — Emergency Department: Payer: Medicaid Other

## 2020-10-28 DIAGNOSIS — R071 Chest pain on breathing: Secondary | ICD-10-CM | POA: Diagnosis not present

## 2020-10-28 DIAGNOSIS — R079 Chest pain, unspecified: Secondary | ICD-10-CM | POA: Diagnosis present

## 2020-10-28 DIAGNOSIS — Z20822 Contact with and (suspected) exposure to covid-19: Secondary | ICD-10-CM | POA: Diagnosis not present

## 2020-10-28 DIAGNOSIS — R051 Acute cough: Secondary | ICD-10-CM | POA: Diagnosis not present

## 2020-10-28 DIAGNOSIS — R1110 Vomiting, unspecified: Secondary | ICD-10-CM | POA: Diagnosis not present

## 2020-10-28 DIAGNOSIS — R0789 Other chest pain: Secondary | ICD-10-CM

## 2020-10-28 LAB — URINALYSIS, COMPLETE (UACMP) WITH MICROSCOPIC
Bilirubin Urine: NEGATIVE
Glucose, UA: NEGATIVE mg/dL
Ketones, ur: NEGATIVE mg/dL
Leukocytes,Ua: NEGATIVE
Nitrite: NEGATIVE
Protein, ur: NEGATIVE mg/dL
Specific Gravity, Urine: 1.002 — ABNORMAL LOW (ref 1.005–1.030)
pH: 7 (ref 5.0–8.0)

## 2020-10-28 LAB — RESP PANEL BY RT-PCR (FLU A&B, COVID) ARPGX2
Influenza A by PCR: NEGATIVE
Influenza B by PCR: NEGATIVE
SARS Coronavirus 2 by RT PCR: NEGATIVE

## 2020-10-28 LAB — POC URINE PREG, ED: Preg Test, Ur: NEGATIVE

## 2020-10-28 MED ORDER — ONDANSETRON 4 MG PO TBDP: 4.0000 mg | ORAL_TABLET | Freq: Three times a day (TID) | ORAL | 0 refills | Status: AC | PRN

## 2020-10-28 MED ORDER — KETOROLAC TROMETHAMINE 30 MG/ML IJ SOLN
30.0000 mg | Freq: Once | INTRAMUSCULAR | Status: AC
  Administered 2020-10-28: 30 mg via INTRAMUSCULAR
  Filled 2020-10-28: qty 1

## 2020-10-28 MED ORDER — BENZONATATE 100 MG PO CAPS: 100.0000 mg | ORAL_CAPSULE | Freq: Three times a day (TID) | ORAL | 0 refills | Status: AC | PRN

## 2020-10-28 MED ORDER — AZITHROMYCIN 250 MG PO TABS: ORAL_TABLET | ORAL | 0 refills | Status: AC

## 2020-10-28 MED ORDER — ONDANSETRON 4 MG PO TBDP
4.0000 mg | ORAL_TABLET | Freq: Once | ORAL | Status: AC
  Administered 2020-10-28: 4 mg via ORAL
  Filled 2020-10-28: qty 1

## 2020-10-28 NOTE — ED Provider Notes (Signed)
Emergency Medicine Provider Triage Evaluation Note  Diane Wyatt , a 25 y.o. female  was evaluated in triage.  Pt complains of chest wall pain similar to what she was experiencing a week ago.  She has been taking Tylenol but did not take naproxen as prescribed.  She has not had any ibuprofen.  She complains of a cough over the last couple days that has been significant without any fevers.  Cough is dry nonproductive.  She describes chest wall pain that is sharp on the left side of her chest with several episodes of vomiting after coughing.  She describes severe nasal congestion.  Negative work-up 1 week ago with normal blood work, troponin, D-dimer, chest x-ray, EKG.  Review of Systems  Positive: Cough, chest pain, vomiting, nasal congestion Negative: Abdominal pain, diarrhea, rashes, fevers  Physical Exam  There were no vitals taken for this visit. Gen:   Awake, no distress   Resp:  Normal effort  MSK:   Moves extremities without difficulty  Other:  Chest wall tender to touch  Medical Decision Making  Medically screening exam initiated at 5:38 PM.  Appropriate orders placed.  [name] was informed that the remainder of the evaluation will be completed by another provider, this initial triage assessment does not replace that evaluation, and the importance of remaining in the ED until their evaluation is complete.  24 year old female with left-sided chest wall pain.  Pain sharp increased with palpation and taking a deep breath.  Will order repeat chest x-ray she has had persistent cough.  We will also order flu/COVID test, urinalysis and urine practice.   Evon Slack, PA-C 10/28/20 1741    Willy Eddy, MD 10/28/20 [name]

## 2020-10-28 NOTE — ED Provider Notes (Signed)
[facility] Emergency Department Provider Note  ____________________________________________   Event Date/Time   First MD Initiated Contact with Patient 10/28/20 1934     (approximate)  I have reviewed the triage vital signs and the nursing notes.   HISTORY  Chief Complaint Chest Pain    HPI Diane Wyatt is a 25 y.o. female who comes in with chest pain.  Patient has similar chest pain to what she was experiencing a week ago.  She is been taking Tylenol but did not take naproxen as prescribed.  She has not had any ibuprofen.  She complains of a cough for the past few days has been significant without any fevers.  This been going on for 4 days, nothing makes it better, nothing makes it worse but now the coughing has made the chest pain worse and sometimes she coughs so much that she has episode of vomiting.  She reports that when she is coughing she feels that she cannot catch her breath.  Denies any abdominal pain.  She had negative work-up 1 week ago with normal blood work, troponin, D-dimer, chest x-ray, EKG.  She denies a history of blood clots.  Is on the Depo shot had a baby over a year ago.          Past Medical History:  Diagnosis Date   Back injury    Medical history non-contributory     Patient Active Problem List   Diagnosis Date Noted   Moderate major depression, single episode (HCC) 09/24/2019   Pregnancy 03/06/2019   Indication for care in labor and delivery, antepartum 01/19/2019    Past Surgical History:  Procedure Laterality Date   HERNIA REPAIR     NO PAST SURGERIES      Prior to Admission medications   Medication Sig Start Date End Date Taking? Authorizing Provider  albuterol (VENTOLIN HFA) 108 (90 Base) MCG/ACT inhaler Inhale 2 puffs into the lungs every 6 (six) hours as needed for wheezing or shortness of breath. 10/12/19   Phineas Semen, MD  DULoxetine (CYMBALTA) 60 MG capsule Take 1 capsule (60 mg total) by mouth  daily. 09/25/19   Clapacs, Jackquline Denmark, MD  Multiple Vitamin (MULTIVITAMIN WITH MINERALS) TABS tablet Take 1 tablet by mouth daily. 09/25/19   Clapacs, Jackquline Denmark, MD  naproxen (NAPROSYN) 500 MG tablet Take 1 tablet (500 mg total) by mouth 2 (two) times daily with a meal. 10/21/20   Jene Every, MD  norgestimate-ethinyl estradiol (ORTHO-CYCLEN) 0.25-35 MG-MCG tablet Take by mouth. 04/09/19 04/08/20  [provider]  ondansetron (ZOFRAN) 4 MG tablet Take 1 tablet (4 mg total) by mouth every 8 (eight) hours as needed. 10/12/19   Phineas Semen, MD    Allergies Doxycycline  Family History  Problem Relation Age of Onset   Healthy Mother    Healthy Father     Social History Social History   Tobacco Use   Smoking status: Never   Smokeless tobacco: Never  Vaping Use   Vaping Use: Never used  Substance Use Topics   Alcohol use: Never   Drug use: Never      Review of Systems Constitutional: No fever/chills Eyes: No visual changes. ENT: No sore throat. Cardiovascular: Positive chest pain Respiratory: Positive shortness of breath, cough Gastrointestinal: No abdominal pain.  No nausea, no vomiting.  No diarrhea.  No constipation. Genitourinary: Negative for dysuria. Musculoskeletal: Negative for back pain. Skin: Negative for rash. Neurological: Negative for headaches, focal weakness or numbness. All other ROS  negative ____________________________________________   PHYSICAL EXAM:  VITAL SIGNS: ED Triage Vitals  Enc Vitals Group     BP 10/28/20 1749 (!) 117/96     Pulse Rate 10/28/20 1749 96     Resp 10/28/20 1749 16     Temp 10/28/20 1749 98.2 F (36.8 C)     Temp Source 10/28/20 1749 Oral     SpO2 10/28/20 1749 100 %     Weight 10/28/20 1739 132 lb 4.4 oz (60 kg)     Height 10/28/20 1739 5\' 1"  (1.549 m)     Head Circumference --      Peak Flow --      Pain Score 10/28/20 1739 6     Pain Loc --      Pain Edu? --      Excl. in GC? --     Constitutional: Alert and  oriented. Well appearing and in no acute distress. Eyes: Conjunctivae are normal. EOMI. Head: Atraumatic. Nose: No congestion/rhinnorhea. Mouth/Throat: Mucous membranes are moist.   Neck: No stridor. Trachea Midline. FROM Cardiovascular: Normal rate, regular rhythm. Grossly normal heart sounds.  Good peripheral circulation.  Chest wall tenderness without any rash Respiratory: Normal respiratory effort.  No retractions. Lungs CTAB.  Frequent coughing Gastrointestinal: Soft and nontender. No distention. No abdominal bruits.  Musculoskeletal: No lower extremity tenderness nor edema.  No joint effusions.  No calf tenderness Neurologic:  Normal speech and language. No gross focal neurologic deficits are appreciated.  Skin:  Skin is warm, dry and intact. No rash noted. Psychiatric: Mood and affect are normal. Speech and behavior are normal. GU: Deferred   ____________________________________________   LABS (all labs ordered are listed, but only abnormal results are displayed)  Labs Reviewed  URINALYSIS, COMPLETE (UACMP) WITH MICROSCOPIC - Abnormal; Notable for the following components:      Result Value   Color, Urine COLORLESS (*)    APPearance CLEAR (*)    Specific Gravity, Urine 1.002 (*)    Hgb urine dipstick SMALL (*)    Bacteria, UA RARE (*)    All other components within normal limits  RESP PANEL BY RT-PCR (FLU A&B, COVID) ARPGX2  POC URINE PREG, ED   ____________________________________________   ED ECG REPORT I, 10/30/20, the attending physician, personally viewed and interpreted this ECG.  Normal sinus rate of 96 without any ST elevation or T wave inversions, normal intervals ____________________________________________  RADIOLOGY Concha Se, personally viewed and evaluated these images (plain radiographs) as part of my medical decision making, as well as reviewing the written report by the radiologist.  ED MD interpretation: No pneumonia Official radiology  report(s): DG Chest 2 View  Result Date: 10/28/2020 CLINICAL DATA:  Cough and chest pain for 1 week. EXAM: CHEST - 2 VIEW COMPARISON:  10/21/2020 FINDINGS: The cardiac silhouette, mediastinal and hilar contours are within limits. The lungs are clear of an acute process. No infiltrates or effusions. No pulmonary lesions. No pneumothorax. The bony thorax is intact. IMPRESSION: No acute cardiopulmonary findings. Electronically Signed   By: 10/23/2020 M.D.   On: 10/28/2020 18:29    ____________________________________________   PROCEDURES  Procedure(s) performed (including Critical Care):  Procedures   ____________________________________________   INITIAL IMPRESSION / ASSESSMENT AND PLAN / ED COURSE   Diane Wyatt was evaluated in Emergency Department on 10/28/2020 for the symptoms described in the history of present illness. She was evaluated in the context of the global COVID-19 pandemic, which necessitated consideration that the patient  might be at risk for infection with the SARS-CoV-2 virus that causes COVID-19. Institutional protocols and algorithms that pertain to the evaluation of patients at risk for COVID-19 are in a state of rapid change based on information released by regulatory bodies including the CDC and federal and state organizations. These policies and algorithms were followed during the patient's care in the ED.    Most Likely DDx:  -MSK (atypical chest pain) suspect this is costochondritis given his reproducible exam and its been flared up now by this cough.  I suspect this is most likely viral.  Did get chest x-ray and COVID, flu swab to further evaluate.  Do not feel further labs are needed given patient had recent D-dimer that is negative and the pain is musculoskeletal in nature and reproducible on exam and she remains 100% on room air with no wheezing to suggest bronchitis and normal heart rates.  DDx that was also considered d/t potential to cause harm, but was  found less likely based on history and physical (as detailed above): -PNA (no fevers, cough but CXR to evaluate) -PNX (reassured with equal b/l breath sounds, CXR to evaluate) -Symptomatic anemia (will get H&H) -Pulmonary embolism as no sob at rest, not pleuritic in nature, no hypoxia -Aortic Dissection as no tearing pain and no radiation to the mid back, pulses equal -Pericarditis no rub on exam, EKG changes or hx to suggest dx -Tamponade (no notable SOB, tachycardic, hypotensive) -Esophageal rupture (no h/o diffuse vomitting/no crepitus)   Chest x-ray negative, COVID, flu are negative.  Discussed symptomatic care.  We will give a course of antibiotics if not improving within a week, Zofran, Tessalon Perles and encouraged her to take the naproxen already prescribed and explained to her how it has an anti-inflammatory effect that the Tylenol does not have.  Patient expressed understanding and felt comfortable with this plan  I discussed the provisional nature of ED diagnosis, the treatment so far, the ongoing plan of care, follow up appointments and return precautions with the patient and any family or support people present. They expressed understanding and agreed with the plan, discharged home.        ____________________________________________   FINAL CLINICAL IMPRESSION(S) / ED DIAGNOSES   Final diagnoses:  Costochondral chest pain  Acute cough     MEDICATIONS GIVEN DURING THIS VISIT:  Medications  ondansetron (ZOFRAN-ODT) disintegrating tablet 4 mg (4 mg Oral Given 10/28/20 1957)  ketorolac (TORADOL) 30 MG/ML injection 30 mg (30 mg Intramuscular Given 10/28/20 1957)     ED Discharge Orders          Ordered    ondansetron (ZOFRAN ODT) 4 MG disintegrating tablet  Every 8 hours PRN        10/28/20 1948    benzonatate (TESSALON PERLES) 100 MG capsule  3 times daily PRN        10/28/20 1948    azithromycin (ZITHROMAX Z-PAK) 250 MG tablet        10/28/20 1948              Note:  This document was prepared using Dragon voice recognition software and may include unintentional dictation errors.    Concha Se, MD 10/28/20 [phone]

## 2020-10-28 NOTE — ED Notes (Signed)
Dc instructions and scripts reviewed with pt. Will follow up as needed. No questions or concerns at this time. Ambulated to waiting area with no difficulty.

## 2020-10-28 NOTE — Discharge Instructions (Addendum)
I suspect the cough is most likely viral but if is not improving you can take the antibiotics, Tessalon Perles help with cough, Zofran to help with nausea.  Take Tylenol 1 g every 6-8 hours and combine that with the naproxen to help with the chest pain.  Follow-up with your primary care doctor if symptoms not relieving or return to the ER if symptoms are getting worse

## 2020-10-28 NOTE — ED Triage Notes (Signed)
Pt comes pov with chest pain. Dx with chest wall pain recently. Has only been taking tylenol. Thayer Ohm, PA at bedside with orders
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
## 2022-02-04 ENCOUNTER — Encounter: Payer: Self-pay | Admitting: Emergency Medicine

## 2022-02-04 ENCOUNTER — Emergency Department: Payer: Medicaid Other

## 2022-02-04 ENCOUNTER — Emergency Department
Admission: EM | Admit: 2022-02-04 | Discharge: 2022-02-04 | Disposition: A | Discharge status: Home / Self Care | Payer: Medicaid Other | Attending: Emergency Medicine | Admitting: Emergency Medicine

## 2022-02-04 ENCOUNTER — Other Ambulatory Visit: Payer: Self-pay

## 2022-02-04 DIAGNOSIS — R1031 Right lower quadrant pain: Secondary | ICD-10-CM | POA: Insufficient documentation

## 2022-02-04 DIAGNOSIS — R102 Pelvic and perineal pain: Secondary | ICD-10-CM | POA: Diagnosis present

## 2022-02-04 LAB — COMPREHENSIVE METABOLIC PANEL
ALT: 12 U/L (ref 0–44)
AST: 19 U/L (ref 15–41)
Albumin: 5 g/dL (ref 3.5–5.0)
Alkaline Phosphatase: 34 U/L — ABNORMAL LOW (ref 38–126)
Anion gap: 9 (ref 5–15)
BUN: 10 mg/dL (ref 6–20)
CO2: 24 mmol/L (ref 22–32)
Calcium: 9.5 mg/dL (ref 8.9–10.3)
Chloride: 106 mmol/L (ref 98–111)
Creatinine, Ser: 0.91 mg/dL (ref 0.44–1.00)
GFR, Estimated: >60 mL/min (ref >60)
Glucose, Bld: 108 mg/dL — ABNORMAL HIGH (ref 70–99)
Potassium: 3.4 mmol/L — ABNORMAL LOW (ref 3.5–5.1)
Sodium: 139 mmol/L (ref 135–145)
Total Bilirubin: 0.8 mg/dL (ref 0.3–1.2)
Total Protein: 7.2 g/dL (ref 6.5–8.1)

## 2022-02-04 LAB — URINALYSIS, ROUTINE W REFLEX MICROSCOPIC
Bilirubin Urine: NEGATIVE
Glucose, UA: NEGATIVE mg/dL
Hgb urine dipstick: NEGATIVE
Ketones, ur: NEGATIVE mg/dL
Leukocytes,Ua: NEGATIVE
Nitrite: NEGATIVE
Protein, ur: NEGATIVE mg/dL
Specific Gravity, Urine: 1.01 (ref 1.005–1.030)
pH: 6 (ref 5.0–8.0)

## 2022-02-04 LAB — LIPASE, BLOOD: Lipase: 39 U/L (ref 11–51)

## 2022-02-04 LAB — CBC
HCT: 41.6 % (ref 36.0–46.0)
Hemoglobin: 13.5 g/dL (ref 12.0–15.0)
MCH: 30.1 pg (ref 26.0–34.0)
MCHC: 32.5 g/dL (ref 30.0–36.0)
MCV: 92.7 fL (ref 80.0–100.0)
Platelets: 198 10*3/uL (ref 150–400)
RBC: 4.49 MIL/uL (ref 3.87–5.11)
RDW: 12.6 % (ref 11.5–15.5)
WBC: 4.6 10*3/uL (ref 4.0–10.5)
nRBC: 0 % (ref 0.0–0.2)

## 2022-02-04 LAB — PREGNANCY, URINE: Preg Test, Ur: NEGATIVE

## 2022-02-04 MED ORDER — ONDANSETRON 4 MG PO TBDP
4.0000 mg | ORAL_TABLET | Freq: Once | ORAL | Status: DC
  Filled 2022-02-04: qty 1

## 2022-02-04 MED ORDER — OXYCODONE HCL 5 MG PO TABS
5.0000 mg | ORAL_TABLET | Freq: Once | ORAL | Status: AC
  Administered 2022-02-04: 5 mg via ORAL
  Filled 2022-02-04: qty 1

## 2022-02-04 MED ORDER — IBUPROFEN 600 MG PO TABS: 600.0000 mg | ORAL_TABLET | Freq: Three times a day (TID) | ORAL | 0 refills | Status: AC | PRN

## 2022-02-04 NOTE — ED Provider Notes (Signed)
[facility] Provider Note    Event Date/Time   First MD Initiated Contact with Patient 02/04/22 1416     (approximate)   History   Abdominal Pain   HPI  Diane Wyatt is a 26 y.o. female with history of ovarian cyst who comes in with concerns for abdominal pain.  Patient reports being told she had large cyst that they were just watching.  She states that today she started developing more pain in her lower pelvic area initially the left lower but then moved into the right lower.  She reports is low pelvic pain.  Reports a lot of stomach if she is previously having CT scans previously that have been reassuring.  She does report a small hernia repair.  She does report being scheduled for endoscopy soon due to some upper stomach issues but this is more the lower abdomen.  Denies any vaginal discharge or concerns for STDs.  Denies any concerns for foreign body.  And been with 1 partner whom she shares a child with and had negative STD testing at that time.  Ports typically being seen at person [facility] for abdominal issues.  She reports having multiple CTs in the past.  She reports having a CT scan last in January that was reassuring.  She states that she is following up with GI in a few weeks for potential endoscopy not sure if she is going to have a colonoscopy.  Physical Exam   Triage Vital Signs: ED Triage Vitals [02/04/22 1320]  Enc Vitals Group     BP (!) 117/90     Pulse Rate (!) 106     Resp 18     Temp 97.7 F (36.5 C)     Temp Source Oral     SpO2 97 %     Weight      Height      Head Circumference      Peak Flow      Pain Score 9     Pain Loc      Pain Edu?      Excl. in [place]?     Most recent vital signs: Vitals:   02/04/22 1320  BP: (!) 117/90  Pulse: (!) 106  Resp: 18  Temp: 97.7 F (36.5 C)  SpO2: 97%     General: Awake, no distress.  CV:  Good peripheral perfusion.  Resp:  Normal effort.  Abd:  No distention.  Tender in the  low pelvic area. Other:     ED Results / Procedures / Treatments   Labs (all labs ordered are listed, but only abnormal results are displayed) Labs Reviewed  COMPREHENSIVE METABOLIC PANEL - Abnormal; Notable for the following components:      Result Value   Potassium 3.4 (*)    Glucose, Bld 108 (*)    Alkaline Phosphatase 34 (*)    All other components within normal limits  URINALYSIS, ROUTINE W REFLEX MICROSCOPIC - Abnormal; Notable for the following components:   Color, Urine YELLOW (*)    APPearance CLEAR (*)    All other components within normal limits  LIPASE, BLOOD  CBC  POC URINE PREG, ED     RADIOLOGY I have reviewed the ultrasound personally interpreted no evidence of any ovarian cyst  PROCEDURES:  Critical Care performed: No  Procedures   MEDICATIONS ORDERED IN ED: Medications  oxyCODONE (Oxy IR/ROXICODONE) immediate release tablet 5 mg (has no administration in time range)     IMPRESSION /  MDM / ASSESSMENT AND PLAN / ED COURSE  I reviewed the triage vital signs and the nursing notes.   Patient's presentation is most consistent with acute presentation with potential threat to life or bodily function.   Differential is pregnancy, ectopic, ovarian cyst ovarian torsion.  Declines concerns for STDs.  Patient reports that she will get a ride home and given a dose of oxycodone.  Pregnancy test negative CMP reassuring.  CBC normal urine without evidence of UTI ultrasound was negative.  On reevaluation patient reports feeling much better abdomen soft nontender ultrasound was negative.  We discussed CT imaging but patient declined stating she just had 1 done recently she is going to follow-up with GI outpatient but at this time she is requesting discharge home.  We discussed pelvic exam again and again denies any concerns for foreign body or STDs and patient declines   FINAL CLINICAL IMPRESSION(S) / ED DIAGNOSES   Final diagnoses:  Pelvic pain     Rx / DC  Orders   ED Discharge Orders     None        Note:  This document was prepared using Dragon voice recognition software and may include unintentional dictation errors.   Vanessa [place], MD 02/04/22 [phone]

## 2022-02-04 NOTE — ED Triage Notes (Signed)
Patient to ED via POV for left sided abd pain. Hx of ovarian cyst- smallest one was 5mm per patient. Saw OB for same in Jan.

## 2022-02-04 NOTE — Discharge Instructions (Signed)
You can take Tylenol 1 g every 8 hours, ibuprofen with food.  Follow-up with your GI team and return to the ER if develop worsening symptoms or any other concerns  IMPRESSION: 1. Normal pelvic ultrasound. 2. Normal ovaries and uterus.

## 2022-03-15 DIAGNOSIS — R42 Dizziness and giddiness: Secondary | ICD-10-CM | POA: Diagnosis not present

## 2022-03-15 NOTE — ED Triage Notes (Addendum)
EMS brings pt in from work for c/o sudden onset dizziness and nausea; pt to triage via w/c with no distress noted; c/o persistent dizziness and frontal/temporal HA; denies any recent illness

## 2022-03-16 ENCOUNTER — Emergency Department
Admission: EM | Admit: 2022-03-16 | Discharge: 2022-03-16 | Disposition: A | Discharge status: Home / Self Care | Payer: Medicaid Other | Attending: Emergency Medicine | Admitting: Emergency Medicine

## 2022-03-16 ENCOUNTER — Other Ambulatory Visit: Payer: Self-pay

## 2022-03-16 ENCOUNTER — Encounter: Payer: Self-pay | Admitting: Emergency Medicine

## 2022-03-16 DIAGNOSIS — R42 Dizziness and giddiness: Secondary | ICD-10-CM

## 2022-03-16 HISTORY — DX: Other disorders of plasma-protein metabolism, not elsewhere classified: E88.09

## 2022-03-16 LAB — BASIC METABOLIC PANEL
Anion gap: 7 (ref 5–15)
BUN: 10 mg/dL (ref 6–20)
CO2: 23 mmol/L (ref 22–32)
Calcium: 8.8 mg/dL — ABNORMAL LOW (ref 8.9–10.3)
Chloride: 108 mmol/L (ref 98–111)
Creatinine, Ser: 0.84 mg/dL (ref 0.44–1.00)
GFR, Estimated: >60 mL/min (ref >60)
Glucose, Bld: 100 mg/dL — ABNORMAL HIGH (ref 70–99)
Potassium: 3.5 mmol/L (ref 3.5–5.1)
Sodium: 138 mmol/L (ref 135–145)

## 2022-03-16 LAB — CBC
HCT: 37.9 % (ref 36.0–46.0)
Hemoglobin: 12.1 g/dL (ref 12.0–15.0)
MCH: 30.6 pg (ref 26.0–34.0)
MCHC: 31.9 g/dL (ref 30.0–36.0)
MCV: 95.7 fL (ref 80.0–100.0)
Platelets: 215 10*3/uL (ref 150–400)
RBC: 3.96 MIL/uL (ref 3.87–5.11)
RDW: 12.7 % (ref 11.5–15.5)
WBC: 4.7 10*3/uL (ref 4.0–10.5)
nRBC: 0 % (ref 0.0–0.2)

## 2022-03-16 LAB — URINALYSIS, ROUTINE W REFLEX MICROSCOPIC
Bilirubin Urine: NEGATIVE
Glucose, UA: NEGATIVE mg/dL
Ketones, ur: NEGATIVE mg/dL
Leukocytes,Ua: NEGATIVE
Nitrite: NEGATIVE
Protein, ur: NEGATIVE mg/dL
Specific Gravity, Urine: 1.003 — ABNORMAL LOW (ref 1.005–1.030)
pH: 6 (ref 5.0–8.0)

## 2022-03-16 LAB — POC URINE PREG, ED: Preg Test, Ur: NEGATIVE

## 2022-03-16 LAB — TROPONIN I (HIGH SENSITIVITY): Troponin I (High Sensitivity): <2 ng/L (ref <18)

## 2022-03-16 MED ORDER — MECLIZINE HCL 25 MG PO TABS: 25.0000 mg | ORAL_TABLET | Freq: Three times a day (TID) | ORAL | 0 refills | Status: AC | PRN

## 2022-03-16 MED ORDER — MECLIZINE HCL 25 MG PO TABS
25.0000 mg | ORAL_TABLET | Freq: Once | ORAL | Status: AC
  Administered 2022-03-16: 25 mg via ORAL
  Filled 2022-03-16: qty 1

## 2022-03-16 NOTE — ED Provider Notes (Signed)
[facility] Provider Note    Event Date/Time   First MD Initiated Contact with Patient 03/16/22 0142     (approximate)   History   Dizziness   HPI  Diane Wyatt is a 27 y.o. female with past medical history of depression who presents because of dizziness.  Symptoms started acutely while she was at work.  Describes both sensation like she was going to pass out and a sensation of a spinning feeling.  Felt like her vision was somewhat blurred and somewhat double.  Did not feel better when states she sat down.  Has been rather constant since onset.  Feels improved when she shuts her eyes.  It is worse when she moves her head around or when she stands up.  She still has intermittent sensation of double vision cannot describe whether it is vertical or horizontal diplopia also has some blurred vision.  She did have significant nausea at the onset of her symptoms but this is resolved.  Denies any chest pain or dyspnea.  Denies any history of similar.  Does have mild frontal headache.     Past Medical History:  Diagnosis Date   Alpha galactosidase deficiency    Back injury    Medical history non-contributory     Patient Active Problem List   Diagnosis Date Noted   Moderate major depression, single episode ([place]) 09/24/2019   Pregnancy 03/06/2019   Indication for care in labor and delivery, antepartum 01/19/2019     Physical Exam  Triage Vital Signs: ED Triage Vitals  Enc Vitals Group     BP 03/16/22 0018 113/79     Pulse Rate 03/16/22 0018 83     Resp 03/16/22 0018 20     Temp 03/16/22 0018 98.7 F (37.1 C)     Temp Source 03/16/22 0018 Oral     SpO2 03/15/22 2348 98 %     Weight 03/16/22 0006 130 lb (59 kg)     Height 03/16/22 0006 '5\' 1"'$  (1.549 m)     Head Circumference --      Peak Flow --      Pain Score 03/16/22 0006 8     Pain Loc --      Pain Edu? --      Excl. in [place]? --     Most recent vital signs: Vitals:   03/15/22 2348 03/16/22 0018   BP:  113/79  Pulse:  83  Resp:  20  Temp:  98.7 F (37.1 C)  SpO2: 98% 99%     General: Awake, no distress.  CV:  Good peripheral perfusion.  Resp:  Normal effort.  Abd:  No distention.  Neuro:             Awake, Alert, Oriented x 3  Other:  Left beating nystagmus, no vertical nystagmus Aox3, nml speech  PERRL, EOMI, face symmetric, nml tongue movement  5/5 strength in the BL upper and lower extremities  Sensation grossly intact in the BL upper and lower extremities  Finger-nose-finger intact BL Normal gait no ataxia  TMs normal bilaterally   ED Results / Procedures / Treatments  Labs (all labs ordered are listed, but only abnormal results are displayed) Labs Reviewed  URINALYSIS, ROUTINE W REFLEX MICROSCOPIC - Abnormal; Notable for the following components:      Result Value   Color, Urine STRAW (*)    APPearance HAZY (*)    Specific Gravity, Urine 1.003 (*)    Hgb urine dipstick  LARGE (*)    Bacteria, UA RARE (*)    All other components within normal limits  BASIC METABOLIC PANEL - Abnormal; Notable for the following components:   Glucose, Bld 100 (*)    Calcium 8.8 (*)    All other components within normal limits  CBC  POC URINE PREG, ED  TROPONIN I (HIGH SENSITIVITY)     EKG  EKG interpretation performed by myself: NSR, nml axis, nml intervals, no acute ischemic changes    RADIOLOGY    PROCEDURES:  Critical Care performed: No  Procedures    MEDICATIONS ORDERED IN ED: Medications  meclizine (ANTIVERT) tablet 25 mg (25 mg Oral Given 03/16/22 0317)     IMPRESSION / MDM / ASSESSMENT AND PLAN / ED COURSE  I reviewed the triage vital signs and the nursing notes.                              Patient's presentation is most consistent with acute complicated illness / injury requiring diagnostic workup.  Differential diagnosis includes, but is not limited to, peripheral vertigo including BPPV, vestibular neuritis, lab enteritis, vasovagal  episode, anemia, less likely posterior stroke, MS, intracranial mass  27 yo female who presents with dizziness.  Came on rather acutely described both as spinning and lightheadedness.  Associated with some blurred vision.  It is clearly worse with movement of the head feels improved with eyes closed but has not really stopped since it started.  She did endorse some intermittent diplopia but not specifically about it has difficulty saying whether it is vertical or horizontal diplopia and describes what sounds more like just blurred vision.  No cardiopulmonary symptoms or other neurologic symptoms.  On exam she overall looks well and comfortable.  She does have left beating unilateral horizontal nystagmus no vertical nystagmus and the rest of her neurologic exam including finger-nose-finger and gait is normal without ataxia.  Labs including a troponin CBC BMP are reassuring pregnancy test is negative.  EKG nonischemic.  Overall my suspicion is that this is peripheral vertigo given her exam and lack of risk factors for CVA.  Will trial meclizine and reassess.  Patient feeling much improved on repeat evaluation.  Denies any diplopia.  Will discharge with meclizine.  Discussed return precautions for any new neurologic symptoms.  Recommended PCP follow-up.        FINAL CLINICAL IMPRESSION(S) / ED DIAGNOSES   Final diagnoses:  Vertigo     Rx / DC Orders   ED Discharge Orders          Ordered    meclizine (ANTIVERT) 25 MG tablet  3 times daily PRN        03/16/22 0414             Note:  This document was prepared using Dragon voice recognition software and may include unintentional dictation errors.   Rada Hay, MD 03/16/22 [phone]

## 2022-10-24 ENCOUNTER — Encounter (HOSPITAL_COMMUNITY): Payer: Self-pay

## 2022-10-24 ENCOUNTER — Emergency Department (HOSPITAL_COMMUNITY)
Admission: EM | Admit: 2022-10-24 | Discharge: 2022-10-25 | Discharge status: Against Medical Advice | Payer: Medicaid Other | Attending: Emergency Medicine | Admitting: Emergency Medicine

## 2022-10-24 DIAGNOSIS — R109 Unspecified abdominal pain: Secondary | ICD-10-CM | POA: Insufficient documentation

## 2022-10-24 DIAGNOSIS — Z5321 Procedure and treatment not carried out due to patient leaving prior to being seen by health care provider: Secondary | ICD-10-CM | POA: Insufficient documentation

## 2022-10-24 DIAGNOSIS — R110 Nausea: Secondary | ICD-10-CM | POA: Diagnosis not present

## 2022-10-24 HISTORY — DX: Gastro-esophageal reflux disease without esophagitis: K21.9

## 2022-10-24 HISTORY — DX: Unspecified abdominal pain: R10.9

## 2022-10-24 NOTE — ED Notes (Signed)
Patient does not want to wait to be seen, pulled OTF.

## 2022-10-24 NOTE — ED Triage Notes (Signed)
Pt arrived POV for abd pain for 1 year intermittently, pt reports "no one has been able to give me a real diagnosis, they said it could be gallbladder or rupture diaphragm." Reports followed by 2 GI specialist in [place]. Pt reports intermittent nausea, denies v/d. A&O x4, VSS. Pt denies a flare up in her acid reflux, reports burning sensation.

## 2023-11-09 ENCOUNTER — Emergency Department

## 2023-11-09 ENCOUNTER — Other Ambulatory Visit: Payer: Self-pay

## 2023-11-09 DIAGNOSIS — Z5321 Procedure and treatment not carried out due to patient leaving prior to being seen by health care provider: Secondary | ICD-10-CM | POA: Insufficient documentation

## 2023-11-09 DIAGNOSIS — R1031 Right lower quadrant pain: Secondary | ICD-10-CM | POA: Insufficient documentation

## 2023-11-09 LAB — URINALYSIS, ROUTINE W REFLEX MICROSCOPIC
Bilirubin Urine: NEGATIVE
Glucose, UA: NEGATIVE mg/dL
Hgb urine dipstick: NEGATIVE
Ketones, ur: NEGATIVE mg/dL
Leukocytes,Ua: NEGATIVE
Nitrite: NEGATIVE
Protein, ur: NEGATIVE mg/dL
Specific Gravity, Urine: 1.018 (ref 1.005–1.030)
pH: 5 (ref 5.0–8.0)

## 2023-11-09 LAB — COMPREHENSIVE METABOLIC PANEL WITH GFR
ALT: 16 U/L (ref 0–44)
AST: 24 U/L (ref 15–41)
Albumin: 3.9 g/dL (ref 3.5–5.0)
Alkaline Phosphatase: 29 U/L — ABNORMAL LOW (ref 38–126)
Anion gap: 12 (ref 5–15)
BUN: 8 mg/dL (ref 6–20)
CO2: 20 mmol/L — ABNORMAL LOW (ref 22–32)
Calcium: 8.8 mg/dL — ABNORMAL LOW (ref 8.9–10.3)
Chloride: 106 mmol/L (ref 98–111)
Creatinine, Ser: 0.75 mg/dL (ref 0.44–1.00)
GFR, Estimated: >60 mL/min (ref >60)
Glucose, Bld: 100 mg/dL — ABNORMAL HIGH (ref 70–99)
Potassium: 3.4 mmol/L — ABNORMAL LOW (ref 3.5–5.1)
Sodium: 138 mmol/L (ref 135–145)
Total Bilirubin: 0.7 mg/dL (ref 0.0–1.2)
Total Protein: 7 g/dL (ref 6.5–8.1)

## 2023-11-09 LAB — CBC
HCT: 35.6 % — ABNORMAL LOW (ref 36.0–46.0)
Hemoglobin: 11.7 g/dL — ABNORMAL LOW (ref 12.0–15.0)
MCH: 30.5 pg (ref 26.0–34.0)
MCHC: 32.9 g/dL (ref 30.0–36.0)
MCV: 93 fL (ref 80.0–100.0)
Platelets: 213 K/uL (ref 150–400)
RBC: 3.83 MIL/uL — ABNORMAL LOW (ref 3.87–5.11)
RDW: 13.2 % (ref 11.5–15.5)
WBC: 5.1 K/uL (ref 4.0–10.5)
nRBC: 0 % (ref 0.0–0.2)

## 2023-11-09 LAB — HCG, QUANTITATIVE, PREGNANCY: hCG, Beta Chain, Quant, S: 32470 m[IU]/mL — ABNORMAL HIGH (ref <5)

## 2023-11-09 LAB — POC URINE PREG, ED: Preg Test, Ur: POSITIVE — AB

## 2023-11-09 LAB — LIPASE, BLOOD: Lipase: 26 U/L (ref 11–51)

## 2023-11-09 NOTE — ED Notes (Signed)
 Lab called to add on Hcg at this time.

## 2023-11-09 NOTE — ED Notes (Signed)
 Unable to locate patient for US  exam at this time.
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
## 2023-11-10 ENCOUNTER — Emergency Department
Admission: EM | Admit: 2023-11-10 | Discharge: 2023-11-10 | Discharge status: Against Medical Advice | Attending: Emergency Medicine | Admitting: Emergency Medicine

## 2023-11-10 NOTE — ED Notes (Signed)
 Unable to locate patient for room at 0147 and again now. Assumed to have left ED without notifying staff.
